# Patient Record
Sex: Female | Born: 1945
Health system: Southern US, Community
[De-identification: ages and names within clinical notes are randomized; demographics above are authoritative.]

## PROBLEM LIST (undated history)

## (undated) DIAGNOSIS — J45909 Unspecified asthma, uncomplicated: Secondary | ICD-10-CM

## (undated) DIAGNOSIS — J40 Bronchitis, not specified as acute or chronic: Secondary | ICD-10-CM

## (undated) DIAGNOSIS — E785 Hyperlipidemia, unspecified: Secondary | ICD-10-CM

## (undated) DIAGNOSIS — T7840XA Allergy, unspecified, initial encounter: Secondary | ICD-10-CM

## (undated) DIAGNOSIS — H409 Unspecified glaucoma: Secondary | ICD-10-CM

## (undated) DIAGNOSIS — D649 Anemia, unspecified: Secondary | ICD-10-CM

## (undated) DIAGNOSIS — H269 Unspecified cataract: Secondary | ICD-10-CM

## (undated) DIAGNOSIS — M1990 Unspecified osteoarthritis, unspecified site: Secondary | ICD-10-CM

## (undated) HISTORY — DX: Hyperlipidemia, unspecified: E78.5

## (undated) HISTORY — DX: Unspecified glaucoma: H40.9

## (undated) HISTORY — DX: Anemia, unspecified: D64.9

## (undated) HISTORY — PX: ROTATOR CUFF REPAIR: SHX139

## (undated) HISTORY — PX: COLONOSCOPY: SHX174

## (undated) HISTORY — DX: Allergy, unspecified, initial encounter: T78.40XA

## (undated) HISTORY — DX: Unspecified asthma, uncomplicated: J45.909

## (undated) HISTORY — DX: Unspecified osteoarthritis, unspecified site: M19.90

## (undated) HISTORY — DX: Unspecified cataract: H26.9

## (undated) HISTORY — DX: Bronchitis, not specified as acute or chronic: J40

---
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
## 1973-01-13 HISTORY — PX: EYE SURGERY: SHX253

## 1999-01-14 HISTORY — PX: FRACTURE SURGERY: SHX138

## 2007-01-12 ENCOUNTER — Other Ambulatory Visit: Admission: RE | Admit: 2007-01-12 | Discharge: 2007-01-12 | Payer: Self-pay | Admitting: Gynecology

## 2010-03-01 ENCOUNTER — Other Ambulatory Visit: Payer: Self-pay | Admitting: Obstetrics

## 2010-03-01 DIAGNOSIS — N94818 Other vulvodynia: Secondary | ICD-10-CM

## 2010-03-01 DIAGNOSIS — N393 Stress incontinence (female) (male): Secondary | ICD-10-CM

## 2010-03-08 ENCOUNTER — Ambulatory Visit
Admission: RE | Admit: 2010-03-08 | Discharge: 2010-03-08 | Disposition: A | Discharge status: Home / Self Care | Payer: 59 | Source: Ambulatory Visit | Attending: Obstetrics | Admitting: Obstetrics

## 2010-03-08 DIAGNOSIS — N393 Stress incontinence (female) (male): Secondary | ICD-10-CM

## 2010-03-08 DIAGNOSIS — N94818 Other vulvodynia: Secondary | ICD-10-CM

## 2010-03-08 MED ORDER — IOHEXOL 300 MG/ML  SOLN
100.0000 mL | Freq: Once | INTRAMUSCULAR | Status: AC | PRN
  Administered 2010-03-08: 100 mL via INTRAVENOUS

## 2011-10-28 ENCOUNTER — Ambulatory Visit (INDEPENDENT_AMBULATORY_CARE_PROVIDER_SITE_OTHER): Payer: 59 | Admitting: Family Medicine

## 2011-10-28 ENCOUNTER — Other Ambulatory Visit: Payer: Self-pay | Admitting: Radiology

## 2011-10-28 VITALS — BP 120/68 | HR 110 | Temp 98.2°F | Resp 16 | Ht 64.0 in | Wt 152.0 lb

## 2011-10-28 DIAGNOSIS — L2089 Other atopic dermatitis: Secondary | ICD-10-CM

## 2011-10-28 DIAGNOSIS — L209 Atopic dermatitis, unspecified: Secondary | ICD-10-CM

## 2011-10-28 DIAGNOSIS — J040 Acute laryngitis: Secondary | ICD-10-CM

## 2011-10-28 DIAGNOSIS — R05 Cough: Secondary | ICD-10-CM

## 2011-10-28 DIAGNOSIS — Z8701 Personal history of pneumonia (recurrent): Secondary | ICD-10-CM

## 2011-10-28 DIAGNOSIS — R059 Cough, unspecified: Secondary | ICD-10-CM

## 2011-10-28 MED ORDER — AZITHROMYCIN 250 MG PO TABS: ORAL_TABLET | ORAL | Status: DC

## 2011-10-28 NOTE — Patient Instructions (Signed)
Saline nasal spray atleast 4 times per day if any nasal congestion, over the counter mucinex , drink plenty of fluids, and voice rest as able.  Antibiotic prescribed - can wait for few days and start this if not improving. Return to clinic if any worsening. You can try Eucerin or lubriderm to area on ear twice per day and over the counter hydrocortisone up to twice per day if itching. If wet appearing, you can try the nystatin, but recheck if not improving.  Follow up with Dr. Milus Glazier for physical.

## 2011-10-28 NOTE — Progress Notes (Signed)
  Subjective:    Patient ID: Merlinda Frederick, female    DOB: 07/27/1945, 66 y.o.   MRN: [id number]  HPI LILIAH DORIAN is a 66 y.o. female Scratchy throat - started 3 nights ago - hoarseness, slight initial cough - few times.  Fatigue.  No known fever.  Slight increased cough, but feels congested in chest.  Sore in upper chest with cough.  Slightly shorter of breath with exertion only.  Recurrent bronchitis and pneumonia in past. Itching, irritated area on outside of R ear.  Has tried moisturizer, but nystatin has worked better in past.   Tx: water, chamomile tea.   Usually likes to take guaiefenesin syrup.  Husband also with recent illness seen here.   Review of Systems  Constitutional: Negative for fever and chills.  Respiratory: Positive for cough.        Objective:   Physical Exam  Vitals reviewed. Constitutional: She is oriented to person, place, and time. She appears well-developed and well-nourished.  HENT:  Head: Normocephalic and atraumatic.    Left Ear: External ear normal.  Mouth/Throat: Oropharynx is clear and moist. No oropharyngeal exudate.  Eyes: EOM are normal. Pupils are equal, round, and reactive to light.  Cardiovascular: Normal rate, regular rhythm, normal heart sounds and intact distal pulses.   Pulmonary/Chest: Effort normal and breath sounds normal. No respiratory distress. She has no wheezes. She has no rales.  Lymphadenopathy:    She has no cervical adenopathy.  Neurological: She is alert and oriented to person, place, and time.  Skin: Skin is warm and dry.  Psychiatric: She has a normal mood and affect. Her behavior is normal.      Assessment & Plan:  JARITZA DUIGNAN is a 66 y.o. female 1. Laryngitis  azithromycin (ZITHROMAX) 250 MG tablet  2. Cough  azithromycin (ZITHROMAX) 250 MG tablet  3. Atopic dermatitis    4. Hx of bacterial pneumonia      URI/laryngitis - with hx of bronchitis/pna.  suspected viral URI at this point but if not improving in next few  days, can fill z pak.    Likely eczema of outer ear - see below.,  rtc precautions, and has cpe scheduled.   Patient Instructions  Saline nasal spray atleast 4 times per day if any nasal congestion, over the counter mucinex , drink plenty of fluids, and voice rest as able.  Antibiotic prescribed - can wait for few days and start this if not improving. Return to clinic if any worsening. You can try Eucerin or lubriderm to area on ear twice per day and over the counter hydrocortisone up to twice per day if itching. If wet appearing, you can try the nystatin, but recheck if not improving.  Follow up with Dr. Milus Glazier for physical.

## 2011-11-15 ENCOUNTER — Ambulatory Visit (INDEPENDENT_AMBULATORY_CARE_PROVIDER_SITE_OTHER): Payer: 59 | Admitting: Family Medicine

## 2011-11-15 VITALS — BP 113/75 | HR 86 | Temp 98.4°F | Resp 16 | Ht 63.75 in | Wt 151.2 lb

## 2011-11-15 DIAGNOSIS — R0982 Postnasal drip: Secondary | ICD-10-CM

## 2011-11-15 DIAGNOSIS — J309 Allergic rhinitis, unspecified: Secondary | ICD-10-CM

## 2011-11-15 DIAGNOSIS — J029 Acute pharyngitis, unspecified: Secondary | ICD-10-CM

## 2011-11-15 LAB — POCT CBC
Granulocyte percent: 70.6 %G (ref 37–80)
Hemoglobin: 13.2 g/dL (ref 12.2–16.2)
MCH, POC: 29.5 pg (ref 27–31.2)
MCHC: 30.3 g/dL — AB (ref 31.8–35.4)
MPV: 8.3 fL (ref 0–99.8)
RBC: 4.47 M/uL (ref 4.04–5.48)
RDW, POC: 14.1 %

## 2011-11-15 LAB — POCT RAPID STREP A (OFFICE): Rapid Strep A Screen: NEGATIVE

## 2011-11-15 MED ORDER — MAGIC MOUTHWASH W/LIDOCAINE: 5.0000 mL | Freq: Four times a day (QID) | ORAL | Status: DC | PRN

## 2011-11-15 MED ORDER — FLUTICASONE PROPIONATE 50 MCG/ACT NA SUSP: 2.0000 | Freq: Every day | NASAL | Status: DC

## 2011-11-15 NOTE — Patient Instructions (Addendum)
Return to the clinic or go to the nearest emergency room if any of your symptoms worsen or new symptoms occur. Start symptomatic care for your sore throat as discussed, including lozenges, chloraseptic, or magic mouthwash if needed. flonase and zyrtec for allergies.  You can also try zantac for possible acid reflux contributing to your symptoms.  Your should receive a call or letter about your lab results within the next week to 10 days.

## 2011-11-15 NOTE — Progress Notes (Signed)
Subjective:    Patient ID: Janet Kramer, female    DOB: 1945-05-16, 66 y.o.   MRN: [id number]  HPI Janet Kramer is a 66 y.o. female  See OV 10/28/11  - dx with likely viral URI/laryngitis, cough, and hx of pneumonia - rx of  Zpak if not improving in few days. Started Z pak after temp went up to 102, green sputum - started Z pak around 10/30/11.  Improved after this, deep cough improved.  Fever resolved. Still with sore throat.  Still sore, but still fatigue, then more sore throat - this part is not improving.  Concerned about strep throat. No recent fever. Does clear throat frequently. Denies heartburn.  Cola few times past few weeks.    Tx: sage tea, saltwater gargles. Aspirin few times per week.    Review of Systems  Constitutional: Negative for fever and chills.  HENT: Negative for trouble swallowing and voice change.   Respiratory: Positive for cough (minimal.  ). Negative for chest tightness and shortness of breath.   Cardiovascular: Negative for chest pain.        Objective:   Physical Exam  Constitutional: She is oriented to person, place, and time. She appears well-developed and well-nourished. No distress.  HENT:  Head: Normocephalic and atraumatic.  Right Ear: Hearing, tympanic membrane, external ear and ear canal normal.  Left Ear: Hearing, tympanic membrane, external ear and ear canal normal.  Nose: Nose normal.  Mouth/Throat: Oropharynx is clear and moist. No oropharyngeal exudate.  Eyes: Conjunctivae normal and EOM are normal. Pupils are equal, round, and reactive to light.  Cardiovascular: Normal rate, regular rhythm, normal heart sounds and intact distal pulses.   No murmur heard. Pulmonary/Chest: Effort normal and breath sounds normal. No respiratory distress. She has no wheezes. She has no rhonchi.  Abdominal: Soft. There is no tenderness. There is no rebound and no guarding.  Lymphadenopathy:    She has no cervical adenopathy (no enlargement - ttp AC's  -bilaterally. ).  Neurological: She is alert and oriented to person, place, and time.  Skin: Skin is warm and dry. No rash noted.  Psychiatric: She has a normal mood and affect. Her behavior is normal.   Results for orders placed in visit on 11/15/11  POCT RAPID STREP A (OFFICE)      Component Value Range   Rapid Strep A Screen Negative  Negative  POCT CBC      Component Value Range   WBC 7.4  4.6 - 10.2 K/uL   Lymph, poc 1.7  0.6 - 3.4   POC LYMPH PERCENT 23.3  10 - 50 %L   MID (cbc) 0.5  0 - 0.9   POC MID % 6.1  0 - 12 %M   POC Granulocyte 5.2  2 - 6.9   Granulocyte percent 70.6  37 - 80 %G   RBC 4.47  4.04 - 5.48 M/uL   Hemoglobin 13.2  12.2 - 16.2 g/dL   HCT, POC 30.8  65.7 - 47.9 %   MCV 97.6 (*) 80 - 97 fL   MCH, POC 29.5  27 - 31.2 pg   MCHC 30.3 (*) 31.8 - 35.4 g/dL   RDW, POC 84.6     Platelet Count, POC 314  142 - 424 K/uL   MPV 8.3  0 - 99.8 fL      Assessment & Plan:  Janet Kramer is a 66 y.o. female 1. Sorethroat  POCT rapid strep A, POCT CBC, Culture,  Group A Strep, Alum & Mag Hydroxide-Simeth (MAGIC MOUTHWASH W/LIDOCAINE) SOLN  2. PND (post-nasal drip)  fluticasone (FLONASE) 50 MCG/ACT nasal spray  3. Allergic rhinitis     Sx care for A.R, viral pharyngitis,  or LPR Check throat cx, sx care as below, rtc, precautions.   Patient Instructions  Return to the clinic or go to the nearest emergency room if any of your symptoms worsen or new symptoms occur. Start symptomatic care for your sore throat as discussed, including lozenges, chloraseptic, or magic mouthwash if needed. flonase and zyrtec for allergies.  You can also try zantac for possible acid reflux contributing to your symptoms.  Your should receive a call or letter about your lab results within the next week to 10 days.

## 2011-11-17 LAB — CULTURE, GROUP A STREP: Organism ID, Bacteria: NORMAL

## 2011-11-20 ENCOUNTER — Ambulatory Visit: Payer: 59

## 2011-11-20 ENCOUNTER — Encounter: Payer: Self-pay | Admitting: Family Medicine

## 2011-11-20 ENCOUNTER — Ambulatory Visit: Payer: Self-pay

## 2011-11-20 ENCOUNTER — Ambulatory Visit (INDEPENDENT_AMBULATORY_CARE_PROVIDER_SITE_OTHER): Payer: 59 | Admitting: Internal Medicine

## 2011-11-20 VITALS — BP 120/70 | HR 82 | Temp 98.3°F | Resp 16 | Ht 63.0 in | Wt 154.4 lb

## 2011-11-20 DIAGNOSIS — R05 Cough: Secondary | ICD-10-CM

## 2011-11-20 DIAGNOSIS — Z0000 Encounter for general adult medical examination without abnormal findings: Secondary | ICD-10-CM

## 2011-11-20 DIAGNOSIS — Z800 Family history of malignant neoplasm of digestive organs: Secondary | ICD-10-CM

## 2011-11-20 LAB — COMPREHENSIVE METABOLIC PANEL
ALT: 39 U/L — ABNORMAL HIGH (ref 0–35)
AST: 27 U/L (ref 0–37)
Albumin: 4.1 g/dL (ref 3.5–5.2)
Alkaline Phosphatase: 79 U/L (ref 39–117)
BUN: 14 mg/dL (ref 6–23)
Calcium: 9.3 mg/dL (ref 8.4–10.5)
Chloride: 105 mEq/L (ref 96–112)
Potassium: 4.1 mEq/L (ref 3.5–5.3)
Sodium: 138 mEq/L (ref 135–145)
Total Protein: 6.8 g/dL (ref 6.0–8.3)

## 2011-11-20 LAB — LIPID PANEL
LDL Cholesterol: 138 mg/dL — ABNORMAL HIGH (ref 0–99)
VLDL: 32 mg/dL (ref 0–40)

## 2011-11-20 LAB — POCT URINALYSIS DIPSTICK
Bilirubin, UA: NEGATIVE
Glucose, UA: NEGATIVE
Nitrite, UA: NEGATIVE
Urobilinogen, UA: 0.2

## 2011-11-20 NOTE — Progress Notes (Signed)
  Subjective:    Patient ID: Janet Kramer, female    DOB: [birth year], 66 y.o.   MRN: [id number]  HPI    Review of Systems  Constitutional: Positive for fatigue.  HENT: Positive for congestion, trouble swallowing, voice change and postnasal drip.   Eyes: Negative.   Respiratory: Positive for cough, shortness of breath and wheezing.   Cardiovascular: Negative.   Gastrointestinal: Negative.   Musculoskeletal: Positive for arthralgias.  Skin: Negative.   Neurological: Negative.   Hematological: Positive for adenopathy.  Psychiatric/Behavioral: Negative.        Objective:   Physical Exam        Assessment & Plan:

## 2011-11-20 NOTE — Progress Notes (Signed)
  Subjective:    Patient ID: Janet Kramer, female    DOB: 03/27/1945, 66 y.o.   MRN: [id number]  HPI Here for medicare cpe. Has post infection cough, counseled. See health hx Dad died of colon cancer , mom lung cancer   Review of Systems See ros scanned    Objective:   Physical Exam  Constitutional: She is oriented to person, place, and time. She appears well-developed and well-nourished.  HENT:  Right Ear: External ear normal.  Left Ear: External ear normal.  Nose: Nose normal.  Mouth/Throat: Oropharynx is clear and moist.  Eyes: EOM are normal. Pupils are equal, round, and reactive to light.  Neck: Normal range of motion. Neck supple. No tracheal deviation present. No thyromegaly present.  Cardiovascular: Normal rate, regular rhythm and normal heart sounds.   Pulmonary/Chest: Effort normal and breath sounds normal. No respiratory distress. She has no wheezes. She has no rales. Right breast exhibits no inverted nipple, no mass, no nipple discharge, no skin change and no tenderness. Left breast exhibits no inverted nipple, no mass, no nipple discharge, no skin change and no tenderness. Breasts are symmetrical.  Abdominal: Bowel sounds are normal. There is no tenderness.  Musculoskeletal: Normal range of motion. She exhibits no edema and no tenderness.  Lymphadenopathy:    She has cervical adenopathy.  Neurological: She is alert and oriented to person, place, and time. She has normal reflexes. She displays normal reflexes. No cranial nerve deficit. She exhibits normal muscle tone. Coordination normal.  Skin: Skin is warm and dry.  Psychiatric: She has a normal mood and affect. Her behavior is normal. Judgment and thought content normal.   [facility] reading (PRIMARY) by  Dr.Guest cxr nl         Assessment & Plan:  Very healthy Schedule colonocopy Offer flu,tdap,shingles vac.

## 2011-11-20 NOTE — Patient Instructions (Signed)

## 2011-11-21 LAB — IFOBT (OCCULT BLOOD): IFOBT: NEGATIVE

## 2011-12-05 ENCOUNTER — Encounter: Payer: Self-pay | Admitting: Gastroenterology

## 2012-01-20 ENCOUNTER — Encounter: Payer: Self-pay | Admitting: Gastroenterology

## 2012-01-20 ENCOUNTER — Ambulatory Visit (AMBULATORY_SURGERY_CENTER): Payer: 59 | Admitting: *Deleted

## 2012-01-20 VITALS — Ht 64.0 in | Wt 155.8 lb

## 2012-01-20 DIAGNOSIS — Z1211 Encounter for screening for malignant neoplasm of colon: Secondary | ICD-10-CM

## 2012-01-20 MED ORDER — MOVIPREP 100 G PO SOLR: ORAL | Status: DC

## 2012-02-03 ENCOUNTER — Ambulatory Visit (AMBULATORY_SURGERY_CENTER): Payer: 59 | Admitting: Gastroenterology

## 2012-02-03 ENCOUNTER — Encounter: Payer: Self-pay | Admitting: Gastroenterology

## 2012-02-03 VITALS — BP 126/73 | HR 64 | Temp 97.3°F | Resp 17 | Ht 64.0 in | Wt 155.0 lb

## 2012-02-03 DIAGNOSIS — Z800 Family history of malignant neoplasm of digestive organs: Secondary | ICD-10-CM

## 2012-02-03 DIAGNOSIS — D126 Benign neoplasm of colon, unspecified: Secondary | ICD-10-CM

## 2012-02-03 DIAGNOSIS — Z1211 Encounter for screening for malignant neoplasm of colon: Secondary | ICD-10-CM

## 2012-02-03 MED ORDER — SODIUM CHLORIDE 0.9 % IV SOLN: 500.0000 mL | INTRAVENOUS | Status: DC

## 2012-02-03 NOTE — Progress Notes (Signed)
Called to room to assist during endoscopic procedure.  Patient ID and intended procedure confirmed with present staff. Received instructions for my participation in the procedure from the performing physician.  

## 2012-02-03 NOTE — Op Note (Signed)
 [facility] [address] [place], [address]   COLONOSCOPY PROCEDURE REPORT  PATIENT: Janet, Kramer  MR#: [id number] BIRTHDATE: 1945-04-30 , 66  yrs. old GENDER: Female ENDOSCOPIST: Meryl Dare, MD, [facility] PROCEDURE DATE:  02/03/2012 PROCEDURE:   Colonoscopy with snare polypectomy and Colonoscopy with biopsy ASA CLASS:   Class II INDICATIONS:elevated risk screening-patient's immediate family history of colon cancer. MEDICATIONS: MAC sedation, administered by CRNA and propofol (Diprivan) 150mg  IV DESCRIPTION OF PROCEDURE:   After the risks benefits and alternatives of the procedure were thoroughly explained, informed consent was obtained.  A digital rectal exam revealed no abnormalities of the rectum.   The LB [id number]  endoscope was introduced through the anus and advanced to the cecum, which was identified by both the appendix and ileocecal valve. No adverse events experienced with a tortuous sigmoid colon-moderately difficult to traverse.   The quality of the prep was excellent, using MoviPrep  The instrument was then slowly withdrawn as the colon was fully examined.  COLON FINDINGS: Two sessile polyps measuring 4-5 mm in size were found in the descending colon.  A polypectomy was performed with a cold snare.  The resection was complete and the polyp tissue was completely retrieved.  A polypectomy was performed with cold forceps.  The resection was complete and the polyp tissue was completely retrieved.   Moderate diverticulosis was noted in the sigmoid colon.   The colon was otherwise normal.  There was no diverticulosis, inflammation, polyps or cancers unless previously stated.  Retroflexed views revealed small internal hemorrhoids. The time to cecum=6 minutes 45 seconds.  Withdrawal time=9 minutes 22 seconds.  The scope was withdrawn and the procedure completed.  COMPLICATIONS: There were no complications.  ENDOSCOPIC IMPRESSION: 1.   Two  sessile polyps measuring 4-5 mm in the descending colon; polypectomy performed with a cold snare; polypectomy was performed with cold forceps 2.   Moderate diverticulosis was noted in the sigmoid colon 3.   Small internal hemorrhoids  RECOMMENDATIONS: 1.  Await pathology results 2.  High fiber diet with liberal fluid intake. 3.  Repeat Colonoscopy in 5 years.  eSigned:  Meryl Dare, MD, [facility] 02/03/2012 10:47 AM   cc: Robert Bellow, MD

## 2012-02-03 NOTE — Progress Notes (Signed)
Patient did not experience any of the following events: a burn prior to discharge; a fall within the facility; wrong site/side/patient/procedure/implant event; or a hospital transfer or hospital admission upon discharge from the facility. (G8907) Patient did not have preoperative order for IV antibiotic SSI prophylaxis. (G8918)  

## 2012-02-03 NOTE — Patient Instructions (Addendum)

## 2012-02-03 NOTE — Progress Notes (Signed)
Lidocaine-40mg IV prior to Propofol InductionPropofol given over incremental dosages 

## 2012-02-04 ENCOUNTER — Telehealth: Payer: Self-pay

## 2012-02-04 NOTE — Telephone Encounter (Signed)
Left message on answering machine. 

## 2012-02-09 ENCOUNTER — Encounter: Payer: Self-pay | Admitting: Gastroenterology

## 2013-02-09 ENCOUNTER — Ambulatory Visit (INDEPENDENT_AMBULATORY_CARE_PROVIDER_SITE_OTHER): Payer: 59 | Admitting: Family Medicine

## 2013-02-09 VITALS — BP 122/78 | HR 88 | Temp 98.8°F | Resp 18 | Ht 63.5 in | Wt 146.0 lb

## 2013-02-09 DIAGNOSIS — R6889 Other general symptoms and signs: Secondary | ICD-10-CM

## 2013-02-09 DIAGNOSIS — J111 Influenza due to unidentified influenza virus with other respiratory manifestations: Secondary | ICD-10-CM

## 2013-02-09 DIAGNOSIS — J029 Acute pharyngitis, unspecified: Secondary | ICD-10-CM

## 2013-02-09 LAB — POCT RAPID STREP A (OFFICE): Rapid Strep A Screen: NEGATIVE

## 2013-02-09 LAB — POCT INFLUENZA A/B
Influenza A, POC: POSITIVE
Influenza B, POC: NEGATIVE

## 2013-02-09 NOTE — Progress Notes (Signed)
 Chief Complaint:  Chief Complaint  Patient presents with  . Cough    x sunday   . Bronchitis    HPI: Janet Kramer is a 68 y.o. female who is here for : 3-4 day history of sore throat, and weak,and she was coughign and fever Temp 102, ASa took care of it. Tired.  NOthing came up when she cough, No ear no sinus pressure, no HA. No rashes.+ vomiting and nausea x 1 Has a history of ashtma. No SOB, wheezing or CP. 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 Bronchitis     has recurrent bronchitis  . Allergy   . Arthritis   . Asthma   . Glaucoma   . Cataract    Past Surgical History  Procedure Laterality Date  . Tonsillectomy  1949  . Fracture surgery  2001    ankle - right     . Eye surgery  1975    right   History   Social History  . Marital Status: Married    Spouse Name: N/A    Number of Children: N/A  . Years of Education: N/A   Occupational History  . tutor [employer]   Social History Main Topics  . Smoking status: Never Smoker   . Smokeless tobacco: Never Used  . Alcohol Use: Yes     Comment: occasional  . Drug Use: No  . Sexual Activity: None   Other Topics Concern  . None   Social History Narrative  . None   Family History  Problem Relation Age of Onset  . Cancer Mother     colon  . Colon cancer Mother 39  . Cancer Father     lung  . Diabetes Paternal Grandmother   . Stroke Maternal Grandmother   . Heart disease Maternal Grandfather   . Stomach cancer Neg Hx    Allergies  Allergen Reactions  . Codeine     Overly sleepy  . Oxycontin [Oxycodone Hcl] Nausea And Vomiting   Prior to Admission medications   Medication Sig Start Date End Date Taking? Authorizing Provider  aspirin 325 MG tablet Take 325 mg by mouth daily.   Yes Historical Provider, MD  latanoprost (XALATAN) 0.005 % ophthalmic solution Place 1 drop into  both eyes at bedtime.   Yes Historical Provider, MD     ROS: The patient denies night sweats, unintentional weight loss, chest pain, palpitations, wheezing, dyspnea on exertion, nausea, vomiting, abdominal pain, dysuria, hematuria, melena, numbness, , or tingling.  All other systems have been reviewed and were otherwise negative with the exception of those mentioned in the HPI and as above.    PHYSICAL EXAM: Filed Vitals:   02/09/13 1341  BP: 122/78  Pulse: 88  Temp: 98.8 F (37.1 C)  Resp: 18   Filed Vitals:   02/09/13 1341  Height: 5' 3.5" (1.613 m)  Weight: 146 lb (66.225 kg)   Body mass index is 25.45 kg/(m^2).  General: Alert, no acute distress HEENT:  Normocephalic, atraumatic, oropharynx patent. EOMI, PERRLA, Tmnl, throat erythematous, no exudates. sinue nontender Cardiovascular:  Regular rate and rhythm, no rubs murmurs or gallops.  No Carotid bruits, radial pulse intact. No pedal edema.  Respiratory: Clear to auscultation bilaterally.  No wheezes, rales, or rhonchi.  No cyanosis, no use of accessory musculature GI: No organomegaly,  abdomen is soft and non-tender, positive bowel sounds.  No masses. Skin: No rashes. Neurologic: Facial musculature symmetric. Psychiatric: Patient is appropriate throughout our interaction. Lymphatic: No cervical lymphadenopathy Musculoskeletal: Gait intact.   LABS: Results for orders placed in visit on 02/09/13  POCT INFLUENZA A/B      Result Value Range   Influenza A, POC Positive     Influenza B, POC Negative    POCT RAPID STREP A (OFFICE)      Result Value Range   Rapid Strep A Screen Negative  Negative     EKG/XRAY:   Primary read interpreted by Dr. Marin Comment at [facility].   ASSESSMENT/PLAN: Encounter Diagnoses  Name Primary?  . Flu-like symptoms   . Acute pharyngitis   . Influenza Yes    Declined Tamiflu since 4 days out Will treat sxs  Gave sxampel of mucinex, cepachol F/u prn Gross sideeffects, risk and benefits, and  alternatives of medications d/w patient. Patient is aware that all medications have potential sideeffects and we are unable to predict every sideeffect or drug-drug interaction that may occur.  , [place], DO 02/09/2013 3:20 PM

## 2013-02-09 NOTE — Patient Instructions (Signed)

## 2013-06-21 ENCOUNTER — Telehealth: Payer: Self-pay

## 2013-06-21 DIAGNOSIS — Z1231 Encounter for screening mammogram for malignant neoplasm of breast: Secondary | ICD-10-CM

## 2013-06-21 NOTE — Telephone Encounter (Signed)
Pt would like to be scheduled for a routine screening mammogram. I have pended

## 2013-06-21 NOTE — Telephone Encounter (Signed)
Pt is needing a referal for a mammogram, has seen Dr.L, Dr.guest, and Dr. Marin Comment over the years; would like to know if she needs an appt. Or can just be written one

## 2013-06-22 ENCOUNTER — Ambulatory Visit (INDEPENDENT_AMBULATORY_CARE_PROVIDER_SITE_OTHER): Payer: 59 | Admitting: Family Medicine

## 2013-06-22 VITALS — BP 122/70 | HR 71 | Temp 98.7°F | Resp 16 | Ht 63.5 in | Wt 146.0 lb

## 2013-06-22 DIAGNOSIS — N6315 Unspecified lump in the right breast, overlapping quadrants: Secondary | ICD-10-CM

## 2013-06-22 DIAGNOSIS — N6320 Unspecified lump in the left breast, unspecified quadrant: Secondary | ICD-10-CM

## 2013-06-22 DIAGNOSIS — N6310 Unspecified lump in the right breast, unspecified quadrant: Principal | ICD-10-CM

## 2013-06-22 DIAGNOSIS — N6325 Unspecified lump in the left breast, overlapping quadrants: Secondary | ICD-10-CM

## 2013-06-22 DIAGNOSIS — N63 Unspecified lump in unspecified breast: Secondary | ICD-10-CM

## 2013-06-22 NOTE — Progress Notes (Signed)
Subjective:    Patient ID: Janet Kramer, female    DOB: 01/08/1946, 68 y.o.   MRN: [id number]  This chart was scribed for Wendie Agreste, MD by Maree Erie, ED Scribe.  Chief Complaint  Patient presents with  . Referral    pt needs a referral for mammogram    PCP: Robyn Haber, MD  HPI  Janet Kramer is a 68 y.o. female who presents to office to get a referral for a mammogram. Last seen here for acute illness in 01/2013, prior to that was a physical in 11/2011. Sent for colonoscopy 01/2012. Polyps noted. Plan to repeat in five years.   Right Breast Irregularity: Here to discuss mammogram referral today. She states that she noticed an indentation last week in her right breast that she has not seen before. She does not know if she can feel an irregularity underneath the indentation. The asymmetry only appears when she is bending over and comparing the breasts. She denies nipple bleeding or discharge or pain. She denies any fever, chills, night sweats or weight loss. She denies a family history of breast cancer or gynecological cancers. She reports her mother had a history of colon cancer but also suffered from diverticulitis and obesity for fifty years. She does not remember her last mammogram date but believes it was at least three years ago. She states that her first mammogram was irregular, but the irregularity was diagnosed as a cyst via [place]. She believes the mammograms since this finding were normal.     There are no active problems to display for this patient.  Past Medical History  Diagnosis Date  . Bronchitis     has recurrent bronchitis  . Allergy   . Arthritis   . Asthma   . Glaucoma   . Cataract    Past Surgical History  Procedure Laterality Date  . Tonsillectomy  1949  . Fracture surgery  2001    ankle - right     . Eye surgery  1975    right   Allergies  Allergen Reactions  . Codeine     Overly sleepy  . Oxycontin [Oxycodone Hcl] Nausea And Vomiting    Prior to Admission medications   Medication Sig Start Date End Date Taking? Authorizing Provider  aspirin 325 MG tablet Take 325 mg by mouth daily.   Yes Historical Provider, MD  latanoprost (XALATAN) 0.005 % ophthalmic solution Place 1 drop into both eyes at bedtime.   Yes Historical Provider, MD   History   Social History  . Marital Status: Married    Spouse Name: N/A    Number of Children: N/A  . Years of Education: N/A   Occupational History  . tutor [employer]   Social History Main Topics  . Smoking status: Never Smoker   . Smokeless tobacco: Never Used  . Alcohol Use: Yes     Comment: occasional  . Drug Use: No  . Sexual Activity: Not on file   Other Topics Concern  . Not on file   Social History Narrative  . No narrative on file     Review of Systems  Constitutional: Negative for fever and chills.  HENT: Negative for rhinorrhea.   Eyes: Negative for visual disturbance.  Respiratory: Negative for cough and shortness of breath.   Cardiovascular: Negative for chest pain and leg swelling.  Gastrointestinal: Negative for nausea, vomiting, abdominal pain and diarrhea.  Genitourinary: Negative for dysuria.  Musculoskeletal: Negative for back pain.  Skin: Negative for rash.  Neurological: Negative for headaches.  Hematological: Does not bruise/bleed easily.  Psychiatric/Behavioral: Negative for confusion.       Objective:   Physical Exam  Nursing note and vitals reviewed. Constitutional: She is oriented to person, place, and time. She appears well-developed and well-nourished. No distress.  HENT:  Head: Normocephalic and atraumatic.  Eyes: Conjunctivae and EOM are normal.  Neck: Normal range of motion. No tracheal deviation present.  Cardiovascular: Normal rate.   Pulmonary/Chest: Effort normal. No respiratory distress.  Slight firm area, slight nodularity to upper-mid right breast, approximately 3-4 cm above nipple when lying flat. It does  appear to be mobile. 1-2 cm below nipple in left breast there is a mobile lump.  Musculoskeletal: Normal range of motion.  Lymphadenopathy:    She has no axillary adenopathy.       Right: No supraclavicular adenopathy present.       Left: No supraclavicular adenopathy present.  No supraclavicular or axillary adenopathy bilaterally.  Neurological: She is alert and oriented to person, place, and time.  Skin: Skin is warm and dry.  Psychiatric: She has a normal mood and affect. Her behavior is normal.    Filed Vitals:   06/22/13 1200  BP: 122/70  Pulse: 71  Temp: 98.7 F (37.1 C)  TempSrc: Oral  Resp: 16  Height: 5' 3.5" (1.613 m)  Weight: 146 lb (66.225 kg)  SpO2: 98%       Assessment & Plan:   Janet Kramer is a 68 y.o. female Breast lump on right side at 12 o'clock position - Plan: MM Digital Diagnostic Bilat  Breast lump on left side at 6 o'clock position - Plan: MM Digital Diagnostic Bilat Possible change in skin appearance/retraction with leaning forward on left. Questionable soft tissue lumps/bilaterraly as above. No known FH of breast CA, ovarian CA,  peritoneal CA or other known malignancy. Will refer for diagnostic MMG.    Will schedule CPE - requests Dr. Marin Comment or Dr. Joseph Art - message sent to scheduling.   No orders of the defined types were placed in this encounter.   Patient Instructions  We will refer you for the mammogram as discussed.  You should receive a call about this in the next week to 10 days. If you have not heard from [place] at that time - call me to make sure this has been scheduled.   We will call you to schedule physical with Dr. Marin Comment or Dr. Joseph Art.    I personally performed the services described in this documentation, which was scribed in my presence. The recorded information has been reviewed and considered, and addended by me as needed.

## 2013-06-22 NOTE — Patient Instructions (Signed)
We will refer you for the mammogram as discussed.  You should receive a call about this in the next week to 10 days. If you have not heard from [place] at that time - call me to make sure this has been scheduled.   We will call you to schedule physical with Dr. Marin Comment or Dr. Joseph Art.

## 2013-06-27 ENCOUNTER — Telehealth: Payer: Self-pay

## 2013-06-27 NOTE — Telephone Encounter (Signed)
PT CALLED IN WANTING TO KNOW IF THE REFERRAL HAD BEEN PUT IN TO THE BRE[facility]. STATED DR TOLD HER TO CALL TODAY AND FIND OUT.  SHE WANTS TO BE CALLED BACK AT [phone]

## 2013-06-27 NOTE — Telephone Encounter (Signed)
Ok to schedule mammogram

## 2013-06-28 ENCOUNTER — Other Ambulatory Visit: Payer: Self-pay | Admitting: Family Medicine

## 2013-06-28 DIAGNOSIS — N6320 Unspecified lump in the left breast, unspecified quadrant: Secondary | ICD-10-CM

## 2013-06-28 DIAGNOSIS — N6310 Unspecified lump in the right breast, unspecified quadrant: Principal | ICD-10-CM

## 2013-06-28 DIAGNOSIS — N6315 Unspecified lump in the right breast, overlapping quadrants: Secondary | ICD-10-CM

## 2013-06-28 DIAGNOSIS — N6325 Unspecified lump in the left breast, overlapping quadrants: Secondary | ICD-10-CM

## 2013-06-29 LAB — HM MAMMOGRAPHY

## 2013-06-29 NOTE — Telephone Encounter (Signed)
Spoke to patient's husband.  Patient is currently having her breast imaging done.

## 2013-07-06 NOTE — Progress Notes (Signed)
Appointment scheduled on 07/22/2013 @ 10 am for CPE

## 2013-07-22 ENCOUNTER — Ambulatory Visit (INDEPENDENT_AMBULATORY_CARE_PROVIDER_SITE_OTHER): Payer: 59 | Admitting: Family Medicine

## 2013-07-22 ENCOUNTER — Encounter: Payer: Self-pay | Admitting: Family Medicine

## 2013-07-22 VITALS — BP 110/70 | HR 68 | Temp 97.9°F | Resp 16 | Ht 63.0 in | Wt 152.2 lb

## 2013-07-22 DIAGNOSIS — E785 Hyperlipidemia, unspecified: Secondary | ICD-10-CM

## 2013-07-22 DIAGNOSIS — Z23 Encounter for immunization: Secondary | ICD-10-CM

## 2013-07-22 DIAGNOSIS — Z0000 Encounter for general adult medical examination without abnormal findings: Secondary | ICD-10-CM

## 2013-07-22 LAB — LIPID PANEL
Cholesterol: 270 mg/dL — ABNORMAL HIGH (ref 0–200)
HDL: 76 mg/dL (ref >39)
LDL Cholesterol: 170 mg/dL — ABNORMAL HIGH (ref 0–99)
Total CHOL/HDL Ratio: 3.6 Ratio
Triglycerides: 122 mg/dL (ref <150)
VLDL: 24 mg/dL (ref 0–40)

## 2013-07-22 LAB — POCT URINALYSIS DIPSTICK
Bilirubin, UA: NEGATIVE
Glucose, UA: NEGATIVE
Ketones, UA: NEGATIVE
Leukocytes, UA: NEGATIVE
Nitrite, UA: NEGATIVE
Protein, UA: NEGATIVE
Spec Grav, UA: <=1.005
Urobilinogen, UA: 0.2
pH, UA: 5.5

## 2013-07-22 LAB — CBC
HCT: 39.3 % (ref 36.0–46.0)
Hemoglobin: 13.5 g/dL (ref 12.0–15.0)
MCH: 31.3 pg (ref 26.0–34.0)
MCHC: 34.4 g/dL (ref 30.0–36.0)
MCV: 91 fL (ref 78.0–100.0)
Platelets: 216 10*3/uL (ref 150–400)
RBC: 4.32 MIL/uL (ref 3.87–5.11)
RDW: 13.6 % (ref 11.5–15.5)
WBC: 4.6 10*3/uL (ref 4.0–10.5)

## 2013-07-22 LAB — COMPLETE METABOLIC PANEL WITHOUT GFR
AST: 20 U/L (ref 0–37)
Albumin: 4.4 g/dL (ref 3.5–5.2)
Alkaline Phosphatase: 53 U/L (ref 39–117)
Calcium: 9.3 mg/dL (ref 8.4–10.5)
Chloride: 106 meq/L (ref 96–112)
Sodium: 141 meq/L (ref 135–145)
Total Protein: 7.2 g/dL (ref 6.0–8.3)

## 2013-07-22 LAB — COMPLETE METABOLIC PANEL WITH GFR
ALT: 18 U/L (ref 0–35)
BUN: 19 mg/dL (ref 6–23)
CO2: 23 mEq/L (ref 19–32)
Creat: 0.88 mg/dL (ref 0.50–1.10)
GFR, Est African American: 77 mL/min
GFR, Est Non African American: 67 mL/min
Glucose, Bld: 92 mg/dL (ref 70–99)
Potassium: 4.1 mEq/L (ref 3.5–5.3)
Total Bilirubin: 0.6 mg/dL (ref 0.2–1.2)

## 2013-07-22 LAB — TSH: TSH: 1.621 u[IU]/mL (ref 0.350–4.500)

## 2013-07-22 NOTE — Progress Notes (Signed)
 Chief Complaint: No chief complaint on file.   HPI: Janet Kramer is a 68 y.o. female who is here for annual PE She is UTD on her colonscopy-had one with Dr Fuller Plan in 2014, 2 polypectomy, f/u colonscopy in 2019 She has ahd mamogram and [place] of breast in 06/2013 for breast cysts, there is supposed to be follow-up in 6 months She has had pap, no abnormal in the past, she does not remember but within last 5 years with ob/gyn  or so for when she had dypareunia, no urinary or pelvic sxs currently She was offered tetanus, shingles last CPE in 2013  but has not done it She sees her eye doctor regular for glaucoma check, recently had retinal exam done because of concerns for detachment but just a pigment in the are of her right eye She has a GD with melanoma since age 61.  Mom with history of colon cancer No urinary symptoms  Past Medical History  Diagnosis Date  . Bronchitis     has recurrent bronchitis  . Allergy   . Arthritis   . Asthma   . Glaucoma   . Cataract   . Hyperlipidemia    Past Surgical History  Procedure Laterality Date  . Tonsillectomy  1949  . Fracture surgery  2001    ankle - right     . Eye surgery  1975    right   History   Social History  . Marital Status: Married    Spouse Name: N/A    Number of Children: N/A  . Years of Education: N/A   Occupational History  . tutor [employer]   Social History Main Topics  . Smoking status: Never Smoker   . Smokeless tobacco: Never Used  . Alcohol Use: Yes     Comment: occasional  . Drug Use: No  . Sexual Activity: None   Other Topics Concern  . None   Social History Narrative  . None   Family History  Problem Relation Age of Onset  . Cancer Mother     colon  . Colon cancer Mother 62  . Cancer Father     lung  . Diabetes Paternal Grandmother   . Stroke Maternal Grandmother   . Heart disease Maternal Grandfather   . Stomach cancer Neg Hx    Allergies  Allergen Reactions  . Codeine       Overly sleepy  . Oxycontin [Oxycodone Hcl] Nausea And Vomiting   Prior to Admission medications   Medication Sig Start Date End Date Taking? Authorizing Provider  aspirin 325 MG tablet Take 325 mg by mouth daily.   Yes Historical Provider, MD  latanoprost (XALATAN) 0.005 % ophthalmic solution Place 1 drop into both eyes at bedtime.   Yes Historical Provider, MD  Multiple Vitamin (MULTIVITAMIN) tablet Take 1 tablet by mouth daily.   Yes Historical Provider, MD     ROS: The patient denies fevers, chills, night sweats, unintentional weight loss, chest pain, palpitations, wheezing, dyspnea on exertion, nausea, vomiting, abdominal pain, dysuria, hematuria, melena, numbness, weakness, or tingling.   All other systems have been reviewed and were otherwise negative with the exception of those mentioned in the HPI and as above.    PHYSICAL EXAM: Filed Vitals:   07/22/13 1010  BP: 110/70  Pulse: 68  Temp: 97.9 F (36.6 C)  Resp: 16   Filed Vitals:   07/22/13 1010  Height: 5\' 3"  (1.6 m)  Weight: 152  lb 3.2 oz (69.037 kg)   Body mass index is 26.97 kg/(m^2).  General: Alert, no acute distress HEENT:  Normocephalic, atraumatic, oropharynx patent. EOMI, PERRLA, fundoexam is normal, tm nl no thyroidmegaly Cardiovascular:  Regular rate and rhythm, no rubs murmurs or gallops.  No Carotid bruits, radial pulse intact. No pedal edema.  Respiratory: Clear to auscultation bilaterally.  No wheezes, rales, or rhonchi.  No cyanosis, no use of accessory musculature GI: No organomegaly, abdomen is soft and non-tender, positive bowel sounds.  No masses. Skin: No rashes. + SK Neurologic: Facial musculature symmetric. Psychiatric: Patient is appropriate throughout our interaction. Lymphatic: No cervical lymphadenopathy Musculoskeletal: Gait intact. Breast exam normal Defer GU exam    LABS: Results for orders placed in visit on 07/22/13  POCT URINALYSIS DIPSTICK      Result Value Ref Range    Color, UA yellow     Clarity, UA clear     Glucose, UA neg     Bilirubin, UA neg     Ketones, UA neg     Spec Grav, UA <=1.005     Blood, UA trace     pH, UA 5.5     Protein, UA neg     Urobilinogen, UA 0.2     Nitrite, UA neg     Leukocytes, UA Negative       EKG/XRAY:   Primary read interpreted by [facility]. Marin Comment at [facility].   ASSESSMENT/PLAN: Encounter Diagnoses  Name Primary?  . Annual physical exam Yes  . Hyperlipidemia   . Need for shingles vaccine    Rx for shingles given She is to follow up with mammo/US for breast cyst in 6 months She is UTD on colonoscopy, next one is 2019 She has had trace blood in her urine since 2013, deferred pelvic exam today.  Annual labs pending F/u prn   Gross sideeffects, risk and benefits, and alternatives of medications d/w patient. Patient is aware that all medications have potential sideeffects and we are unable to predict every sideeffect or drug-drug interaction that may occur.  , Dearing, DO 07/22/2013 12:53 PM

## 2013-07-23 ENCOUNTER — Encounter: Payer: Self-pay | Admitting: Family Medicine

## 2013-12-09 ENCOUNTER — Ambulatory Visit (INDEPENDENT_AMBULATORY_CARE_PROVIDER_SITE_OTHER): Payer: 59 | Admitting: Emergency Medicine

## 2013-12-09 VITALS — BP 122/64 | HR 84 | Temp 97.7°F | Resp 18 | Ht 63.0 in | Wt 149.8 lb

## 2013-12-09 DIAGNOSIS — M7551 Bursitis of right shoulder: Secondary | ICD-10-CM

## 2013-12-09 DIAGNOSIS — M25511 Pain in right shoulder: Secondary | ICD-10-CM

## 2013-12-09 MED ORDER — TRIAMCINOLONE ACETONIDE 40 MG/ML IJ SUSP: 40.0000 mg | Freq: Once | INTRAMUSCULAR | Status: DC

## 2013-12-09 NOTE — Progress Notes (Signed)
[facility] [address], [place] [address] [phone]  Date:  12/09/2013   Name:  Janet Kramer   DOB:  [birth year]   MRN:  [id number]  PCP:  Robyn Haber, MD    Chief Complaint: Shoulder Pain   History of Present Illness:  Janet Kramer is a 69 y.o. very pleasant female patient who presents with the following:  No history of injury.  Has pain in the right shoulder that interferes with movement. Pain is in lateral shoulder. Non radiating  No neuro or weakness Pain worse with movement. Started after lifting a briefcase No improvement with over the counter medications or other home remedies. Denies other complaint or health concern today.    There are no active problems to display for this patient.   Past Medical History  Diagnosis Date  . Bronchitis     has recurrent bronchitis  . Allergy   . Arthritis   . Asthma   . Glaucoma   . Cataract   . Hyperlipidemia     Past Surgical History  Procedure Laterality Date  . Tonsillectomy  1949  . Fracture surgery  2001    ankle - right     . Eye surgery  1975    right    History  Substance Use Topics  . Smoking status: Never Smoker   . Smokeless tobacco: Never Used  . Alcohol Use: Yes     Comment: occasional    Family History  Problem Relation Age of Onset  . Cancer Mother     colon  . Colon cancer Mother 41  . Cancer Father     lung  . Diabetes Paternal Grandmother   . Stroke Maternal Grandmother   . Heart disease Maternal Grandfather   . Stomach cancer Neg Hx     Allergies  Allergen Reactions  . Codeine     Overly sleepy  . Oxycontin [Oxycodone Hcl] Nausea And Vomiting    Medication list has been reviewed and updated.  Current Outpatient Prescriptions on File Prior to Visit  Medication Sig Dispense Refill  . aspirin 325 MG tablet Take 325 mg by mouth daily.    Marland Kitchen latanoprost (XALATAN) 0.005 % ophthalmic solution Place 1 drop into both eyes at bedtime.    . Multiple  Vitamin (MULTIVITAMIN) tablet Take 1 tablet by mouth daily.     No current facility-administered medications on file prior to visit.    Review of Systems:  As per HPI, otherwise negative.    Physical Examination: Filed Vitals:   12/09/13 1314  BP: 122/64  Pulse: 84  Temp: 97.7 F (36.5 C)  Resp: 18   Filed Vitals:   12/09/13 1314  Height: 5\' 3"  (1.6 m)  Weight: 149 lb 12.8 oz (67.949 kg)   Body mass index is 26.54 kg/(m^2). Ideal Body Weight: Weight in (lb) to have BMI = 25: 140.8   GEN: WDWN, NAD, Non-toxic, Alert & Oriented x 3 HEENT: Atraumatic, Normocephalic.  Ears and Nose: No external deformity. EXTR: No clubbing/cyanosis/edema NEURO: Normal gait.  PSYCH: Normally interactive. Conversant. Not depressed or anxious appearing.  Calm demeanor.  Right shoulder:  Tender lateral shoulder won't abduct  Assessment and Plan: Shoulder bursitis Aleve Kenalog and marcaine  Signed,  Ellison Carwin, MD

## 2013-12-09 NOTE — Patient Instructions (Signed)
Bursitis °Bursitis is a swelling and soreness (inflammation) of a fluid-filled sac (bursa) that overlies and protects a joint. It can be caused by injury, overuse of the joint, arthritis or infection. The joints most likely to be affected are the elbows, shoulders, hips and knees. °HOME CARE INSTRUCTIONS  °· Apply ice to the affected area for 15-20 minutes each hour while awake for 2 days. Put the ice in a plastic bag and place a towel between the bag of ice and your skin. °· Rest the injured joint as much as possible, but continue to put the joint through a full range of motion, 4 times per day. (The shoulder joint especially becomes rapidly "frozen" if not used.) When the pain lessens, begin normal slow movements and usual activities. °· Only take over-the-counter or prescription medicines for pain, discomfort or fever as directed by your caregiver. °· Your caregiver may recommend draining the bursa and injecting medicine into the bursa. This may help the healing process. °· Follow all instructions for follow-up with your caregiver. This includes any orthopedic referrals, physical therapy and rehabilitation. Any delay in obtaining necessary care could result in a delay or failure of the bursitis to heal and chronic pain. °SEEK IMMEDIATE MEDICAL CARE IF:  °· Your pain increases even during treatment. °· You develop an oral temperature above 102° F (38.9° C) and have heat and inflammation over the involved bursa. °MAKE SURE YOU:  °· Understand these instructions. °· Will watch your condition. °· Will get help right away if you are not doing well or get worse. °Document Released: 12/28/1999 Document Revised: 03/24/2011 Document Reviewed: 03/21/2013 °Exit[employer]® Patient Information ©2015 [employer]. This information is not intended to replace advice given to you by your health care provider. Make sure you discuss any questions you have with your health care provider. ° °

## 2013-12-29 ENCOUNTER — Telehealth: Payer: Self-pay

## 2013-12-29 LAB — HM MAMMOGRAPHY

## 2013-12-29 NOTE — Telephone Encounter (Signed)
Spoke to patient's husband.  He is not sure if patient has had her flu shot or not, but he will remind her to get it.

## 2014-01-03 ENCOUNTER — Ambulatory Visit (INDEPENDENT_AMBULATORY_CARE_PROVIDER_SITE_OTHER): Payer: Commercial Managed Care - HMO | Admitting: Family Medicine

## 2014-01-03 VITALS — BP 128/82 | HR 88 | Temp 98.2°F | Resp 16 | Ht 63.0 in | Wt 150.6 lb

## 2014-01-03 DIAGNOSIS — R319 Hematuria, unspecified: Secondary | ICD-10-CM

## 2014-01-03 DIAGNOSIS — N3091 Cystitis, unspecified with hematuria: Secondary | ICD-10-CM

## 2014-01-03 LAB — POCT URINALYSIS DIPSTICK
BILIRUBIN UA: NEGATIVE
Glucose, UA: NEGATIVE
KETONES UA: NEGATIVE
Nitrite, UA: POSITIVE
PROTEIN UA: NEGATIVE
Spec Grav, UA: 1.015
Urobilinogen, UA: 0.2
pH, UA: 6.5

## 2014-01-03 LAB — POCT UA - MICROSCOPIC ONLY
Casts, Ur, LPF, POC: NEGATIVE
Crystals, Ur, HPF, POC: NEGATIVE
Mucus, UA: NEGATIVE
Yeast, UA: NEGATIVE

## 2014-01-03 MED ORDER — SULFAMETHOXAZOLE-TRIMETHOPRIM 800-160 MG PO TABS: 1.0000 | ORAL_TABLET | Freq: Two times a day (BID) | ORAL | Status: DC

## 2014-01-03 NOTE — Progress Notes (Signed)
Subjective:    Patient ID: Janet Kramer, female    DOB: October 19, 1945, 68 y.o.   MRN: [id number]  HPI Janet Kramer is a 68 y.o. female   Noticed blood in urine this morning, worsened during day - few small clots - strings of blood, then improved  Has had some discomfort with urination, urinary frequency today.    No fever/N/V/abd pain.  Few days ago noticed some lower twinge of back pain with leaning over to drawer, then resolved. No hx of kidney stones.  One twinge of pain in back pain that resolved with a nap.  No back pain now.   No attempted treatments.  Has been drinking fluids ok. Drinks Control and instrumentation engineer.    Has noticed trace blood on urine in past at physicals.  Was seen by urology and no specific workup was recommended then.   Took naproxen 7 times in November - some upset stomach then, not now.  Has stopped this medicine.   There are no active problems to display for this patient.  Past Medical History  Diagnosis Date  . Bronchitis     has recurrent bronchitis  . Allergy   . Arthritis   . Asthma   . Glaucoma   . Cataract   . Hyperlipidemia    Past Surgical History  Procedure Laterality Date  . Tonsillectomy  1949  . Fracture surgery  2001    ankle - right     . Eye surgery  1975    right   Allergies  Allergen Reactions  . Codeine     Overly sleepy  . Oxycontin [Oxycodone Hcl] Nausea And Vomiting   Prior to Admission medications   Medication Sig Start Date End Date Taking? Authorizing Provider  aspirin 325 MG tablet Take 325 mg by mouth daily.    Historical Provider, MD  latanoprost (XALATAN) 0.005 % ophthalmic solution Place 1 drop into both eyes at bedtime.    Historical Provider, MD  Multiple Vitamin (MULTIVITAMIN) tablet Take 1 tablet by mouth daily.    Historical Provider, MD   History   Social History  . Marital Status: Married    Spouse Name: N/A    Number of Children: N/A  . Years of Education: N/A   Occupational History  . tutor [employer]   Social History Main Topics  . Smoking status: Never Smoker   . Smokeless tobacco: Never Used  . Alcohol Use: Yes     Comment: occasional  . Drug Use: No  . Sexual Activity: Not on file   Other Topics Concern  . Not on file   Social History Narrative      Review of Systems  Constitutional: Negative for fever and chills.  Gastrointestinal: Positive for abdominal pain. Negative for nausea and vomiting.       Lower abdomen/bladder area  Genitourinary: Positive for dysuria, urgency, frequency and hematuria. Negative for vaginal bleeding, vaginal discharge, difficulty urinating, vaginal pain, menstrual problem and pelvic pain.  Musculoskeletal: Negative for back pain.       Objective:   Physical Exam  Constitutional: She is oriented to person, place, and time. She appears well-developed and well-nourished.  HENT:  Head: Normocephalic and atraumatic.  Pulmonary/Chest: Effort normal.  Abdominal: Soft. Normal appearance. She exhibits no distension. There is no tenderness (min ttp suprapubic. only, no rebound, no guarding. ). There is no rebound, no guarding and no CVA tenderness.  Neurological: She is alert and oriented to person, place, and  time.  Skin: Skin is warm.  Psychiatric: She has a normal mood and affect. Her behavior is normal.   Filed Vitals:   01/03/14 1844  BP: 128/82  Pulse: 88  Temp: 98.2 F (36.8 C)  TempSrc: Oral  Resp: 16  Height: 5\' 3"  (1.6 m)  Weight: 150 lb 9.6 oz (68.312 kg)  SpO2: 99%   Results for orders placed or performed in visit on 01/03/14  POCT UA - Microscopic Only  Result Value Ref Range   WBC, Ur, HPF, POC 25-35    RBC, urine, microscopic 30-35    Bacteria, U Microscopic 1+    Mucus, UA neg    Epithelial cells, urine per micros 2-4    Crystals, Ur, HPF, POC neg    Casts, Ur, LPF, POC neg    Yeast, UA neg   POCT urinalysis dipstick  Result Value Ref Range   Color, UA yellow    Clarity, UA cloudy    Glucose, UA neg      Bilirubin, UA neg    Ketones, UA neg    Spec Grav, UA 1.015    Blood, UA large    pH, UA 6.5    Protein, UA neg    Urobilinogen, UA 0.2    Nitrite, UA positive    Leukocytes, UA moderate (2+)         Assessment & Plan:   ALEXSUS Kramer is a 68 y.o. female Hematuria - Plan: POCT UA - Microscopic Only, POCT urinalysis dipstick, sulfamethoxazole-trimethoprim (BACTRIM DS) 800-160 MG per tablet, Urine culture  Hemorrhagic cystitis - Plan: sulfamethoxazole-trimethoprim (BACTRIM DS) 800-160 MG per tablet, Urine culture  -start septra - SED and RTC precautions. Sx care with fluids and h/o below.  Can recheck urine at next physical but hx of trace blood and prior urology workup.    Meds ordered this encounter  Medications  . sulfamethoxazole-trimethoprim (BACTRIM DS) 800-160 MG per tablet    Sig: Take 1 tablet by mouth 2 (two) times daily.    Dispense:  10 tablet    Refill:  0   Patient Instructions  You should receive a call or letter about your lab results within the next week to 10 days.     Urinary Tract Infection Urinary tract infections (UTIs) can develop anywhere along your urinary tract. Your urinary tract is your body's drainage system for removing wastes and extra water. Your urinary tract includes two kidneys, two ureters, a bladder, and a urethra. Your kidneys are a pair of bean-shaped organs. Each kidney is about the size of your fist. They are located below your ribs, one on each side of your spine. CAUSES Infections are caused by microbes, which are microscopic organisms, including fungi, viruses, and bacteria. These organisms are so small that they can only be seen through a microscope. Bacteria are the microbes that most commonly cause UTIs. SYMPTOMS  Symptoms of UTIs may vary by age and gender of the patient and by the location of the infection. Symptoms in young women typically include a frequent and intense urge to urinate and a painful, burning feeling in the  bladder or urethra during urination. Older women and men are more likely to be tired, shaky, and weak and have muscle aches and abdominal pain. A fever may mean the infection is in your kidneys. Other symptoms of a kidney infection include pain in your back or sides below the ribs, nausea, and vomiting. DIAGNOSIS To diagnose a UTI, your caregiver will ask you  about your symptoms. Your caregiver also will ask to provide a urine sample. The urine sample will be tested for bacteria and white blood cells. White blood cells are made by your body to help fight infection. TREATMENT  Typically, UTIs can be treated with medication. Because most UTIs are caused by a bacterial infection, they usually can be treated with the use of antibiotics. The choice of antibiotic and length of treatment depend on your symptoms and the type of bacteria causing your infection. HOME CARE INSTRUCTIONS  If you were prescribed antibiotics, take them exactly as your caregiver instructs you. Finish the medication even if you feel better after you have only taken some of the medication.  Drink enough water and fluids to keep your urine clear or pale yellow.  Avoid caffeine, tea, and carbonated beverages. They tend to irritate your bladder.  Empty your bladder often. Avoid holding urine for long periods of time.  Empty your bladder before and after sexual intercourse.  After a bowel movement, women should cleanse from front to back. Use each tissue only once. SEEK MEDICAL CARE IF:   You have back pain.  You develop a fever.  Your symptoms do not begin to resolve within 3 days. SEEK IMMEDIATE MEDICAL CARE IF:   You have severe back pain or lower abdominal pain.  You develop chills.  You have nausea or vomiting.  You have continued burning or discomfort with urination. MAKE SURE YOU:   Understand these instructions.  Will watch your condition.  Will get help right away if you are not doing well or get  worse. Document Released: 10/09/2004 Document Revised: 07/01/2011 Document Reviewed: 02/07/2011 [facility] Patient Information 2015 [place], [place]. This information is not intended to replace advice given to you by your health care provider. Make sure you discuss any questions you have with your health care provider.  Hematuria Hematuria is blood in your urine. It can be caused by a bladder infection, kidney infection, prostate infection, kidney stone, or cancer of your urinary tract. Infections can usually be treated with medicine, and a kidney stone usually will pass through your urine. If neither of these is the cause of your hematuria, further workup to find out the reason may be needed. It is very important that you tell your health care provider about any blood you see in your urine, even if the blood stops without treatment or happens without causing pain. Blood in your urine that happens and then stops and then happens again can be a symptom of a very serious condition. Also, pain is not a symptom in the initial stages of many urinary cancers. HOME CARE INSTRUCTIONS   Drink lots of fluid, 3-4 quarts a day. If you have been diagnosed with an infection, cranberry juice is especially recommended, in addition to large amounts of water.  Avoid caffeine, tea, and carbonated beverages because they tend to irritate the bladder.  Avoid alcohol because it may irritate the prostate.  Take all medicines as directed by your health care provider.  If you were prescribed an antibiotic medicine, finish it all even if you start to feel better.  If you have been diagnosed with a kidney stone, follow your health care provider's instructions regarding straining your urine to catch the stone.  Empty your bladder often. Avoid holding urine for long periods of time.  After a bowel movement, women should cleanse front to back. Use each tissue only once.  Empty your bladder before and after sexual intercourse  if  you are a female. SEEK MEDICAL CARE IF:  You develop back pain.  You have a fever.  You have a feeling of sickness in your stomach (nausea) or vomiting.  Your symptoms are not better in 3 days. Return sooner if you are getting worse. SEEK IMMEDIATE MEDICAL CARE IF:   You develop severe vomiting and are unable to keep the medicine down.  You develop severe back or abdominal pain despite taking your medicines.  You begin passing a large amount of blood or clots in your urine.  You feel extremely weak or faint, or you pass out. MAKE SURE YOU:   Understand these instructions.  Will watch your condition.  Will get help right away if you are not doing well or get worse. Document Released: 12/30/2004 Document Revised: 05/16/2013 Document Reviewed: 08/30/2012 [facility] Patient Information 2015 [place], [place]. This information is not intended to replace advice given to you by your health care provider. Make sure you discuss any questions you have with your health care provider.

## 2014-01-03 NOTE — Patient Instructions (Signed)
You should receive a call or letter about your lab results within the next week to 10 days.     Urinary Tract Infection Urinary tract infections (UTIs) can develop anywhere along your urinary tract. Your urinary tract is your body's drainage system for removing wastes and extra water. Your urinary tract includes two kidneys, two ureters, a bladder, and a urethra. Your kidneys are a pair of bean-shaped organs. Each kidney is about the size of your fist. They are located below your ribs, one on each side of your spine. CAUSES Infections are caused by microbes, which are microscopic organisms, including fungi, viruses, and bacteria. These organisms are so small that they can only be seen through a microscope. Bacteria are the microbes that most commonly cause UTIs. SYMPTOMS  Symptoms of UTIs may vary by age and gender of the patient and by the location of the infection. Symptoms in young women typically include a frequent and intense urge to urinate and a painful, burning feeling in the bladder or urethra during urination. Older women and men are more likely to be tired, shaky, and weak and have muscle aches and abdominal pain. A fever may mean the infection is in your kidneys. Other symptoms of a kidney infection include pain in your back or sides below the ribs, nausea, and vomiting. DIAGNOSIS To diagnose a UTI, your caregiver will ask you about your symptoms. Your caregiver also will ask to provide a urine sample. The urine sample will be tested for bacteria and white blood cells. White blood cells are made by your body to help fight infection. TREATMENT  Typically, UTIs can be treated with medication. Because most UTIs are caused by a bacterial infection, they usually can be treated with the use of antibiotics. The choice of antibiotic and length of treatment depend on your symptoms and the type of bacteria causing your infection. HOME CARE INSTRUCTIONS  If you were prescribed antibiotics, take them  exactly as your caregiver instructs you. Finish the medication even if you feel better after you have only taken some of the medication.  Drink enough water and fluids to keep your urine clear or pale yellow.  Avoid caffeine, tea, and carbonated beverages. They tend to irritate your bladder.  Empty your bladder often. Avoid holding urine for long periods of time.  Empty your bladder before and after sexual intercourse.  After a bowel movement, women should cleanse from front to back. Use each tissue only once. SEEK MEDICAL CARE IF:   You have back pain.  You develop a fever.  Your symptoms do not begin to resolve within 3 days. SEEK IMMEDIATE MEDICAL CARE IF:   You have severe back pain or lower abdominal pain.  You develop chills.  You have nausea or vomiting.  You have continued burning or discomfort with urination. MAKE SURE YOU:   Understand these instructions.  Will watch your condition.  Will get help right away if you are not doing well or get worse. Document Released: 10/09/2004 Document Revised: 07/01/2011 Document Reviewed: 02/07/2011 [facility] Patient Information 2015 [place], [place]. This information is not intended to replace advice given to you by your health care provider. Make sure you discuss any questions you have with your health care provider.  Hematuria Hematuria is blood in your urine. It can be caused by a bladder infection, kidney infection, prostate infection, kidney stone, or cancer of your urinary tract. Infections can usually be treated with medicine, and a kidney stone usually will pass through your urine.  If neither of these is the cause of your hematuria, further workup to find out the reason may be needed. It is very important that you tell your health care provider about any blood you see in your urine, even if the blood stops without treatment or happens without causing pain. Blood in your urine that happens and then stops and then happens again  can be a symptom of a very serious condition. Also, pain is not a symptom in the initial stages of many urinary cancers. HOME CARE INSTRUCTIONS   Drink lots of fluid, 3-4 quarts a day. If you have been diagnosed with an infection, cranberry juice is especially recommended, in addition to large amounts of water.  Avoid caffeine, tea, and carbonated beverages because they tend to irritate the bladder.  Avoid alcohol because it may irritate the prostate.  Take all medicines as directed by your health care provider.  If you were prescribed an antibiotic medicine, finish it all even if you start to feel better.  If you have been diagnosed with a kidney stone, follow your health care provider's instructions regarding straining your urine to catch the stone.  Empty your bladder often. Avoid holding urine for long periods of time.  After a bowel movement, women should cleanse front to back. Use each tissue only once.  Empty your bladder before and after sexual intercourse if you are a female. SEEK MEDICAL CARE IF:  You develop back pain.  You have a fever.  You have a feeling of sickness in your stomach (nausea) or vomiting.  Your symptoms are not better in 3 days. Return sooner if you are getting worse. SEEK IMMEDIATE MEDICAL CARE IF:   You develop severe vomiting and are unable to keep the medicine down.  You develop severe back or abdominal pain despite taking your medicines.  You begin passing a large amount of blood or clots in your urine.  You feel extremely weak or faint, or you pass out. MAKE SURE YOU:   Understand these instructions.  Will watch your condition.  Will get help right away if you are not doing well or get worse. Document Released: 12/30/2004 Document Revised: 05/16/2013 Document Reviewed: 08/30/2012 [facility] Patient Information 2015 [place], [place]. This information is not intended to replace advice given to you by your health care provider. Make sure you  discuss any questions you have with your health care provider.

## 2014-01-06 LAB — URINE CULTURE: Colony Count: >=100000

## 2014-02-18 ENCOUNTER — Ambulatory Visit (INDEPENDENT_AMBULATORY_CARE_PROVIDER_SITE_OTHER): Payer: Commercial Managed Care - HMO | Admitting: Internal Medicine

## 2014-02-18 ENCOUNTER — Ambulatory Visit (INDEPENDENT_AMBULATORY_CARE_PROVIDER_SITE_OTHER): Payer: Commercial Managed Care - HMO

## 2014-02-18 VITALS — BP 132/78 | HR 75 | Temp 97.5°F | Resp 16 | Ht 63.0 in | Wt 152.0 lb

## 2014-02-18 DIAGNOSIS — M25511 Pain in right shoulder: Secondary | ICD-10-CM

## 2014-02-18 DIAGNOSIS — M75101 Unspecified rotator cuff tear or rupture of right shoulder, not specified as traumatic: Secondary | ICD-10-CM

## 2014-02-18 DIAGNOSIS — H8110 Benign paroxysmal vertigo, unspecified ear: Secondary | ICD-10-CM

## 2014-02-18 MED ORDER — TRAMADOL HCL 50 MG PO TABS: ORAL_TABLET | ORAL | Status: DC

## 2014-02-18 NOTE — Progress Notes (Signed)
Subjective:  This chart was scribed for Tami Lin, MD by Dellis Filbert, ED Scribe at [facility].The patient was seen in exam room 08 and the patient's care was started at 12:57 PM.   Patient ID: Janet Kramer, female    DOB: Jun 19, 1945, 69 y.o.   MRN: [id number] Chief Complaint  Patient presents with  . Dizziness    started x1 week   HPI HPI Comments: Janet Kramer is a 69 y.o. female who presents to [facility] complaining of ongoing right shoulder pain. She was seen here on 12/09/2013  and diagnosed with shoulder bursitis. Pt was given a 40 mg kenalog injection Dr Ouida Sills. Pt states the pain has "returned with a vengeance". She believes it is tendinitis as she has full ROM of her right shoulder. She developed the pain initially when she lifted a heavy piece of luggage over a chair--she felt a pop in the shoulder. The pain radiates down her right arm and her right shoulder is more swollen than left. 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 Certain movements give her pain.   Pt also vertigo, began one week ago. Pt got up to get ready for work and felt lightheaded. Pt states she "lurched" to the left twice. The vertigo quickly passed Has a Hx of low blood pressure. Changing positions increases the likelihood of the vertigo episodes. When she lays down and closes her eyes she states it feels like "a rocking back and forth".No nausea, change in her heart rate associated with the vertigo. No new medications. Takes aspirin, calcium supplements and a multivitamin,       There are no active problems to display for this patient.  Past Medical History  Diagnosis Date  . Bronchitis     has recurrent bronchitis  . Allergy   . Arthritis   . Asthma   . Glaucoma   . Cataract   . Hyperlipidemia    Past Surgical History  Procedure Laterality Date  . Tonsillectomy  1949  . Fracture surgery  2001    ankle - right     . Eye surgery  1975    right   Allergies    Allergen Reactions  . Codeine     Overly sleepy  . Oxycontin [Oxycodone Hcl] Nausea And Vomiting   Prior to Admission medications   Medication Sig Start Date End Date Taking? Authorizing Provider  aspirin 325 MG tablet Take 325 mg by mouth daily.   Yes Historical Provider, MD  Cholecalciferol (VITAMIN D PO) Take by mouth.   Yes Historical Provider, MD  Multiple Vitamin (MULTIVITAMIN) tablet Take 1 tablet by mouth daily.   Yes Historical Provider, MD   History   Social History  . Marital Status: Married    Spouse Name: N/A    Number of Children: N/A  . Years of Education: N/A   Occupational History  . tutor [employer]   Social History Main Topics  . Smoking status: Never Smoker   . Smokeless tobacco: Never Used  . Alcohol Use: Yes     Comment: occasional  . Drug Use: No  . Sexual Activity: Not on file   Other Topics Concern  . Not on file   Social History Narrative   Review of Systems No fever chills or night sweats No weight loss No vision difficulties No palpitations/no diaphoresis No tremor No memory changes No sleep issues except for the being interrupted recently by pain  Objective:  BP 132/78 mmHg  Pulse 75  Temp(Src) 97.5 F (36.4 C) (Oral)  Resp 16  Ht 5\' 3"  (1.6 m)  Wt 152 lb (68.947 kg)  BMI 26.93 kg/m2  SpO2 100%  Physical Exam  Constitutional: She is oriented to person, place, and time. She appears well-developed and well-nourished. No distress.  HENT:  Head: Normocephalic and atraumatic.  Right Ear: External ear normal.  Left Ear: External ear normal.  Nose: Nose normal.  Mouth/Throat: Oropharynx is clear and moist.  Eyes: Conjunctivae and EOM are normal. Pupils are equal, round, and reactive to light.  Neck: Normal range of motion. Neck supple. No thyromegaly present.  Cardiovascular: Normal rate, regular rhythm and normal heart sounds.   Pulmonary/Chest: Effort normal. No respiratory distress.  Musculoskeletal: Normal  range of motion.  The right anterior shoulder is TPP with pain on resisted AB duction and AD duction.  Tenderness is mainly in the bicipital groove and anterior shoulder   Lymphadenopathy:    She has no cervical adenopathy.  Neurological: She is alert and oriented to person, place, and time. She has normal reflexes. She displays normal reflexes. No cranial nerve deficit. She exhibits normal muscle tone. Coordination normal.  Hallpike Dix maneuver produces very mild sensation of vertigo but no nystagmus. The sensation resolved within seconds The cerebellar exam is negative  Skin: Skin is warm and dry.  Psychiatric: She has a normal mood and affect. Her behavior is normal. Thought content normal.  Nursing note and vitals reviewed.  [facility] reading (PRIMARY) by  Dr. Laney Pastor normal shoulder x-ray         Assessment & Plan:  Pain in joint, shoulder region, right -? Rotator cuff tear, right  The exam suggests some impingement type problem although the history fits more with a rotator cuff tear Will refer to Dr Delilah Shan to see if [place] can make a dx meds to sleep if needed  BPV (benign positional vertigo), unspecified laterality  Nestor Lewandowsky manuvers if symptomatic again    Meds ordered this encounter  Medications  . traMADol (ULTRAM) 50 MG tablet    Sig: 1-2 at hs for pain    Dispense:  30 tablet    Refill:  0      I have completed the patient encounter in its entirety as documented by the scribe, with editing by me where necessary. Robert P. Laney Pastor, M.D.

## 2014-03-02 DIAGNOSIS — M75101 Unspecified rotator cuff tear or rupture of right shoulder, not specified as traumatic: Secondary | ICD-10-CM | POA: Diagnosis not present

## 2014-03-07 DIAGNOSIS — H4011X1 Primary open-angle glaucoma, mild stage: Secondary | ICD-10-CM | POA: Diagnosis not present

## 2014-03-07 DIAGNOSIS — H2513 Age-related nuclear cataract, bilateral: Secondary | ICD-10-CM | POA: Diagnosis not present

## 2014-03-08 DIAGNOSIS — M25511 Pain in right shoulder: Secondary | ICD-10-CM | POA: Diagnosis not present

## 2014-03-13 DIAGNOSIS — M75101 Unspecified rotator cuff tear or rupture of right shoulder, not specified as traumatic: Secondary | ICD-10-CM | POA: Diagnosis not present

## 2014-03-31 DIAGNOSIS — H0011 Chalazion right upper eyelid: Secondary | ICD-10-CM | POA: Diagnosis not present

## 2014-04-06 DIAGNOSIS — S46011A Strain of muscle(s) and tendon(s) of the rotator cuff of right shoulder, initial encounter: Secondary | ICD-10-CM | POA: Diagnosis not present

## 2014-04-06 DIAGNOSIS — M7541 Impingement syndrome of right shoulder: Secondary | ICD-10-CM | POA: Diagnosis not present

## 2014-04-06 DIAGNOSIS — G8918 Other acute postprocedural pain: Secondary | ICD-10-CM | POA: Diagnosis not present

## 2014-04-06 DIAGNOSIS — Y929 Unspecified place or not applicable: Secondary | ICD-10-CM | POA: Diagnosis not present

## 2014-04-06 DIAGNOSIS — X58XXXA Exposure to other specified factors, initial encounter: Secondary | ICD-10-CM | POA: Diagnosis not present

## 2014-04-06 DIAGNOSIS — S43421A Sprain of right rotator cuff capsule, initial encounter: Secondary | ICD-10-CM | POA: Diagnosis not present

## 2014-04-06 DIAGNOSIS — M24111 Other articular cartilage disorders, right shoulder: Secondary | ICD-10-CM | POA: Diagnosis not present

## 2014-04-19 DIAGNOSIS — M25511 Pain in right shoulder: Secondary | ICD-10-CM | POA: Diagnosis not present

## 2014-05-17 DIAGNOSIS — M75101 Unspecified rotator cuff tear or rupture of right shoulder, not specified as traumatic: Secondary | ICD-10-CM | POA: Diagnosis not present

## 2014-06-20 DIAGNOSIS — M25511 Pain in right shoulder: Secondary | ICD-10-CM | POA: Diagnosis not present

## 2014-06-20 DIAGNOSIS — M7021 Olecranon bursitis, right elbow: Secondary | ICD-10-CM | POA: Diagnosis not present

## 2014-06-22 ENCOUNTER — Encounter: Payer: Self-pay | Admitting: *Deleted

## 2014-06-23 ENCOUNTER — Telehealth: Payer: Self-pay | Admitting: *Deleted

## 2014-06-23 ENCOUNTER — Encounter: Payer: Self-pay | Admitting: *Deleted

## 2014-06-23 NOTE — Telephone Encounter (Signed)
Received mammo/ultrasound from [place] dated for 12/29/2013 - updated health maintenance & abstracted.

## 2014-07-05 DIAGNOSIS — M25511 Pain in right shoulder: Secondary | ICD-10-CM | POA: Diagnosis not present

## 2014-08-10 DIAGNOSIS — H2513 Age-related nuclear cataract, bilateral: Secondary | ICD-10-CM | POA: Diagnosis not present

## 2014-08-18 ENCOUNTER — Telehealth: Payer: Self-pay

## 2014-08-18 DIAGNOSIS — H269 Unspecified cataract: Secondary | ICD-10-CM

## 2014-08-18 NOTE — Telephone Encounter (Signed)
Please advise 

## 2014-08-18 NOTE — Telephone Encounter (Signed)
Patient called to request a referral to a surgeon for cataract surgery.  She hasn't been seen in our office for eye-related problems, so I am unsure if she needs to be seen about her eyes before the referral can be made.  Please contact the patient.  [phone]

## 2014-08-18 NOTE — Telephone Encounter (Signed)
Referral to Dr. Katy Fitch placed. Thank you!

## 2014-08-21 NOTE — Telephone Encounter (Signed)
She would like to see Dr. Bing Plume not Dr. Katy Fitch. The surgery is already scheduled. Please change.

## 2014-08-22 DIAGNOSIS — H2513 Age-related nuclear cataract, bilateral: Secondary | ICD-10-CM | POA: Diagnosis not present

## 2014-08-22 DIAGNOSIS — H524 Presbyopia: Secondary | ICD-10-CM | POA: Diagnosis not present

## 2014-08-22 DIAGNOSIS — H2512 Age-related nuclear cataract, left eye: Secondary | ICD-10-CM | POA: Diagnosis not present

## 2014-09-25 DIAGNOSIS — H2512 Age-related nuclear cataract, left eye: Secondary | ICD-10-CM | POA: Diagnosis not present

## 2014-09-25 DIAGNOSIS — H25812 Combined forms of age-related cataract, left eye: Secondary | ICD-10-CM | POA: Diagnosis not present

## 2014-10-09 DIAGNOSIS — H2511 Age-related nuclear cataract, right eye: Secondary | ICD-10-CM | POA: Diagnosis not present

## 2014-10-09 DIAGNOSIS — H25811 Combined forms of age-related cataract, right eye: Secondary | ICD-10-CM | POA: Diagnosis not present

## 2015-02-22 ENCOUNTER — Encounter: Payer: Self-pay | Admitting: Family Medicine

## 2015-03-27 ENCOUNTER — Encounter: Payer: Self-pay | Admitting: Family Medicine

## 2015-04-20 ENCOUNTER — Telehealth: Payer: Self-pay

## 2015-04-20 NOTE — Telephone Encounter (Signed)
[place] [id number]

## 2015-04-20 NOTE — Telephone Encounter (Signed)
Whitney from [facility] is calling to request a [facility] referral. The appointment is April 11th. Diagnoses code: H25.13 NPI: [id number]

## 2015-04-24 DIAGNOSIS — Z961 Presence of intraocular lens: Secondary | ICD-10-CM | POA: Diagnosis not present

## 2015-04-24 DIAGNOSIS — H401131 Primary open-angle glaucoma, bilateral, mild stage: Secondary | ICD-10-CM | POA: Diagnosis not present

## 2015-10-26 ENCOUNTER — Telehealth: Payer: Self-pay | Admitting: *Deleted

## 2015-10-26 DIAGNOSIS — H409 Unspecified glaucoma: Secondary | ICD-10-CM

## 2015-10-26 NOTE — Telephone Encounter (Signed)
Dr. Bing Plume needs a referral again for patient 6 month follow up because she has humana.  Order is pended.  After order is placed please send to referrals to precert.

## 2015-10-26 NOTE — Telephone Encounter (Signed)
Orders Placed This Encounter  Procedures  . Ambulatory referral to Ophthalmology    Referral Priority:   Routine    Referral Type:   Consultation    Referral Reason:   Specialty Services Required    Requested Specialty:   Ophthalmology    Number of Visits Requested:   1    

## 2015-10-29 NOTE — Telephone Encounter (Signed)
Pre-auth has been sent over! Thanks!

## 2015-11-01 DIAGNOSIS — H401131 Primary open-angle glaucoma, bilateral, mild stage: Secondary | ICD-10-CM | POA: Diagnosis not present

## 2016-01-09 ENCOUNTER — Encounter: Payer: Self-pay | Admitting: Family Medicine

## 2016-01-16 ENCOUNTER — Encounter: Payer: Self-pay | Admitting: Family Medicine

## 2016-01-16 ENCOUNTER — Ambulatory Visit (INDEPENDENT_AMBULATORY_CARE_PROVIDER_SITE_OTHER): Payer: Commercial Managed Care - HMO | Admitting: Family Medicine

## 2016-01-16 VITALS — BP 121/71 | HR 88 | Temp 98.8°F | Resp 16 | Ht 63.0 in | Wt 151.0 lb

## 2016-01-16 DIAGNOSIS — Z1159 Encounter for screening for other viral diseases: Secondary | ICD-10-CM

## 2016-01-16 DIAGNOSIS — Z0000 Encounter for general adult medical examination without abnormal findings: Secondary | ICD-10-CM

## 2016-01-16 DIAGNOSIS — Z23 Encounter for immunization: Secondary | ICD-10-CM | POA: Diagnosis not present

## 2016-01-16 DIAGNOSIS — N760 Acute vaginitis: Secondary | ICD-10-CM

## 2016-01-16 DIAGNOSIS — N898 Other specified noninflammatory disorders of vagina: Secondary | ICD-10-CM

## 2016-01-16 DIAGNOSIS — B9689 Other specified bacterial agents as the cause of diseases classified elsewhere: Secondary | ICD-10-CM

## 2016-01-16 DIAGNOSIS — J3089 Other allergic rhinitis: Secondary | ICD-10-CM

## 2016-01-16 DIAGNOSIS — Z131 Encounter for screening for diabetes mellitus: Secondary | ICD-10-CM

## 2016-01-16 DIAGNOSIS — E7800 Pure hypercholesterolemia, unspecified: Secondary | ICD-10-CM

## 2016-01-16 LAB — POCT URINALYSIS DIP (MANUAL ENTRY)
Glucose, UA: NEGATIVE
NITRITE UA: NEGATIVE
PH UA: 5
PROTEIN UA: NEGATIVE
Spec Grav, UA: 1.02
UROBILINOGEN UA: 0.2

## 2016-01-16 MED ORDER — FLUTICASONE PROPIONATE 50 MCG/ACT NA SUSP: 2.0000 | Freq: Every day | NASAL | 6 refills | Status: DC

## 2016-01-16 NOTE — Patient Instructions (Addendum)
   IF you received an x-ray today, you will receive an invoice from [facility]. Please contact  Radiology at [phone] with questions or concerns regarding your invoice.   IF you received labwork today, you will receive an invoice from [facility]. Please contact [facility] at [phone] with questions or concerns regarding your invoice.   Our billing staff will not be able to assist you with questions regarding bills from these companies.  You will be contacted with the lab results as soon as they are available. The fastest way to get your results is to activate your My Chart account. Instructions are located on the last page of this paperwork. If you have not heard from us regarding the results in 2 weeks, please contact this office.     Keeping You Healthy  Get These Tests  Blood Pressure- Have your blood pressure checked by your healthcare provider at least once a year.  Normal blood pressure is 120/80.  Weight- Have your body mass index (BMI) calculated to screen for obesity.  BMI is a measure of body fat based on height and weight.  You can calculate your own BMI at www.nhlbisupport.com/bmi/  Cholesterol- Have your cholesterol checked every year.  Diabetes- Have your blood sugar checked every year if you have high blood pressure, high cholesterol, a family history of diabetes or if you are overweight.  Pap Test - Have a pap test every 1 to 5 years if you have been sexually active.  If you are older than 65 and recent pap tests have been normal you may not need additional pap tests.  In addition, if you have had a hysterectomy  for benign disease additional pap tests are not necessary.  Mammogram-Yearly mammograms are essential for early detection of breast cancer  Screening for Colon Cancer- Colonoscopy starting at age 50. Screening may begin sooner depending on your family history and other health conditions.  Follow up colonoscopy as directed by your  Gastroenterologist.  Screening for Osteoporosis- Screening begins at age 65 with bone density scanning, sooner if you are at higher risk for developing Osteoporosis.  Get these medicines  Calcium with Vitamin D- Your body requires 1200-1500 mg of Calcium a day and 800-1000 IU of Vitamin D a day.  You can only absorb 500 mg of Calcium at a time therefore Calcium must be taken in 2 or 3 separate doses throughout the day.  Hormones- Hormone therapy has been associated with increased risk for certain cancers and heart disease.  Talk to your healthcare provider about if you need relief from menopausal symptoms.  Aspirin- Ask your healthcare provider about taking Aspirin to prevent Heart Disease and Stroke.  Get these Immuniztions  Flu shot- Every fall  Pneumonia shot- Once after the age of 65; if you are younger ask your healthcare provider if you need a pneumonia shot.  Tetanus- Every ten years.  Zostavax- Once after the age of 60 to prevent shingles.  Take these steps  Don't smoke- Your healthcare provider can help you quit. For tips on how to quit, ask your healthcare provider or go to www.smokefree.gov or call 1-800 QUIT-NOW.  Be physically active- Exercise 5 days a week for a minimum of 30 minutes.  If you are not already physically active, start slow and gradually work up to 30 minutes of moderate physical activity.  Try walking, dancing, bike riding, swimming, etc.  Eat a healthy diet- Eat a variety of healthy foods such as fruits, vegetables, whole grains, low   fat milk, low fat cheeses, yogurt, lean meats, chicken, fish, eggs, dried beans, tofu, etc.  For more information go to www.thenutritionsource.org  Dental visit- Brush and floss teeth twice daily; visit your dentist twice a year.  Eye exam- Visit your Optometrist or Ophthalmologist yearly.  Drink alcohol in moderation- Limit alcohol intake to one drink or less a day.  Never drink and drive.  Depression- Your emotional  health is as important as your physical health.  If you're feeling down or losing interest in things you normally enjoy, please talk to your healthcare provider.  Seat Belts- can save your life; always wear one  Smoke/Carbon Monoxide detectors- These detectors need to be installed on the appropriate level of your home.  Replace batteries at least once a year.  Violence- If anyone is threatening or hurting you, please tell your healthcare provider.  Living Will/ Health care power of attorney- Discuss with your healthcare provider and family.  

## 2016-01-16 NOTE — Progress Notes (Signed)
Subjective:    Patient ID: Janet Kramer, female    DOB: 16-Aug-1945, 71 y.o.   MRN: [id number]  01/16/2016  Annual Exam   HPI This 71 y.o. female presents for Annual Wellness Examination and chronic follow-up.  Last physical:  2015 Pap smear: n/a Mammogram: 06/2013;  Colonoscopy: 2014 Bone density:  Gynecologist office four years ago; mild osteopenia. Family hx Is strong until 88s.  Ankle fracture fifteen years ago when getting ready for work; slipped on steps.    Immunization History  Administered Date(s) Administered  . Pneumococcal Conjugate-13 01/16/2016   BP Readings from Last 3 Encounters:  01/16/16 121/71  02/18/14 132/78  01/03/14 128/82   Wt Readings from Last 3 Encounters:  01/16/16 151 lb (68.5 kg)  02/18/14 152 lb (68.9 kg)  01/03/14 150 lb 9.6 oz (68.3 kg)   Chronic vaginal discharge: onset for years; s/p extensive gynecological exma in past; prescribed medication; prescribed estrogen  Caused itching.  No other medications.  Review of Systems  Constitutional: Negative for activity change, appetite change, chills, diaphoresis, fatigue, fever and unexpected weight change.  HENT: Negative for congestion, dental problem, drooling, ear discharge, ear pain, facial swelling, hearing loss, mouth sores, nosebleeds, postnasal drip, rhinorrhea, sinus pressure, sneezing, sore throat, tinnitus, trouble swallowing and voice change.   Eyes: Negative for photophobia, pain, discharge, redness, itching and visual disturbance.  Respiratory: Negative for apnea, cough, choking, chest tightness, shortness of breath, wheezing and stridor.   Cardiovascular: Negative for chest pain, palpitations and leg swelling.  Gastrointestinal: Negative for abdominal distention, abdominal pain, anal bleeding, blood in stool, constipation, diarrhea, nausea, rectal pain and vomiting.  Endocrine: Negative for cold intolerance, heat intolerance, polydipsia, polyphagia and polyuria.  Genitourinary: Positive  for vaginal discharge. Negative for decreased urine volume, difficulty urinating, dyspareunia, dysuria, enuresis, flank pain, frequency, genital sores, hematuria, menstrual problem, pelvic pain, urgency, vaginal bleeding and vaginal pain.       Nocturia x 1.  No urinary leakage  Musculoskeletal: Negative for arthralgias, back pain, gait problem, joint swelling, myalgias, neck pain and neck stiffness.  Skin: Negative for color change, pallor, rash and wound.  Allergic/Immunologic: Negative for environmental allergies, food allergies and immunocompromised state.  Neurological: Negative for dizziness, tremors, seizures, syncope, facial asymmetry, speech difficulty, weakness, light-headedness, numbness and headaches.  Hematological: Negative for adenopathy. Does not bruise/bleed easily.  Psychiatric/Behavioral: Negative for agitation, behavioral problems, confusion, decreased concentration, dysphoric mood, hallucinations, self-injury, sleep disturbance and suicidal ideas. The patient is not nervous/anxious and is not hyperactive.     Past Medical History:  Diagnosis Date  . Allergy   . Arthritis   . Asthma   . Bronchitis    has recurrent bronchitis  . Cataract   . Glaucoma   . Hyperlipidemia    Past Surgical History:  Procedure Laterality Date  . [place]   right  . FRACTURE SURGERY  2001   ankle - right     . ROTATOR CUFF REPAIR    . TONSILLECTOMY  1949   Allergies  Allergen Reactions  . Codeine     Overly sleepy  . Oxycontin [Oxycodone Hcl] Nausea And Vomiting   Current Outpatient Prescriptions  Medication Sig Dispense Refill  . aspirin 325 MG tablet Take 325 mg by mouth daily.    Marland Kitchen latanoprost (XALATAN) 0.005 % ophthalmic solution 1 drop at bedtime.    . fluticasone (FLONASE) 50 MCG/ACT nasal spray Place 2 sprays into both nostrils daily. 16 g 6  . metroNIDAZOLE (  METROGEL) 0.75 % vaginal gel Place 1 Applicatorful vaginally 2 (two) times daily. 70 g 0   Current  Facility-Administered Medications  Medication Dose Route Frequency Provider Last Rate Last Dose  . triamcinolone acetonide (KENALOG-40) injection 40 mg  40 mg Intramuscular Once Roselee Culver, MD       Social History   Social History  . Marital status: Married    Spouse name: N/A  . Number of children: 2  . Years of education: N/A   Occupational History  . [employer]   Social History Main Topics  . Smoking status: Never Smoker  . Smokeless tobacco: Never Used  . Alcohol use Yes     Comment: occasional  . Drug use: No  . Sexual activity: Not on file   Other Topics Concern  . 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      ADLs: independent with ADLs.      Advanced Directives: NO; FULL CODE; no prolonged measures; HCPOA: daughter not husband; [name].   Family History  Problem Relation Age of Onset  . Cancer Mother     colon  . Colon cancer Mother 38  . Cancer Father     lung  . Diabetes Paternal Grandmother   . Stroke Maternal Grandmother   . Heart disease Maternal Grandfather   . Stomach cancer Neg Hx        Objective:    BP 121/71   Pulse 88   Temp 98.8 F (37.1 C) (Oral)   Resp 16   Ht 5\' 3"  (1.6 m)   Wt 151 lb (68.5 kg)   BMI 26.75 kg/m  Physical Exam  Constitutional: She is oriented to person, place, and time. She appears well-developed and well-nourished. No distress.  HENT:  Head: Normocephalic and atraumatic.  Right Ear: External ear normal.  Left Ear: External ear normal.  Nose: Nose normal.  Mouth/Throat: Oropharynx is clear and moist.  Eyes: Conjunctivae and EOM are normal. Pupils are equal,  round, and reactive to light.  Neck: Normal range of motion and full passive range of motion without pain. Neck supple. No JVD present. Carotid bruit is not present. No thyromegaly present.  Cardiovascular: Normal rate, regular rhythm, normal heart sounds and intact distal pulses.  Exam reveals no gallop and no friction rub.   No murmur heard. Pulmonary/Chest: Effort normal and breath sounds normal. She has no wheezes. She has no rales. Right breast exhibits no inverted nipple, no mass, no nipple discharge, no skin change and no tenderness. Left breast exhibits no inverted nipple, no mass, no nipple discharge, no skin change and no tenderness.  Abdominal: Soft. Bowel sounds are normal. She exhibits no distension and no mass. There is no tenderness. There is no rebound and no guarding.  Genitourinary: Uterus normal. There is no rash, tenderness or lesion on the right labia. There is no rash, tenderness or lesion on the left labia. Cervix exhibits no motion tenderness, no discharge and no friability. Right adnexum displays no mass, no tenderness and no  fullness. Left adnexum displays no mass, no tenderness and no fullness. Vaginal discharge found.  Genitourinary Comments: Brown thin vaginal discharge in vaginal vault.  Musculoskeletal:       Right shoulder: Normal.       Left shoulder: Normal.       Cervical back: Normal.  Lymphadenopathy:    She has no cervical adenopathy.  Neurological: She is alert and oriented to person, place, and time. She has normal reflexes. No cranial nerve deficit. She exhibits normal muscle tone. Coordination normal.  Skin: Skin is warm and dry. No rash noted. She is not diaphoretic. No erythema. No pallor.  Psychiatric: She has a normal mood and affect. Her behavior is normal. Judgment and thought content normal.  Nursing note and vitals reviewed.  Results for orders placed or performed in visit on 01/16/16  Wet Prep for Trick, Yeast, Clue  Result Value Ref Range    Trichomonas Exam CANCELED   Vaginitis/Vaginosis, DNA Probe  Result Value Ref Range   Candida Species Negative Negative   Gardnerella vaginalis Positive (A) Negative   Trichomonas vaginosis Negative Negative  CBC with Differential/Platelet  Result Value Ref Range   WBC 5.9 3.4 - 10.8 x10E3/uL   RBC 4.25 3.77 - 5.28 x10E6/uL   Hemoglobin 12.9 11.1 - 15.9 g/dL   Hematocrit 39.3 34.0 - 46.6 %   MCV 93 79 - 97 fL   MCH 30.4 26.6 - 33.0 pg   MCHC 32.8 31.5 - 35.7 g/dL   RDW 13.4 12.3 - 15.4 %   Platelets 265 150 - 379 x10E3/uL   Neutrophils 61 Not Estab. %   Lymphs 25 Not Estab. %   Monocytes 10 Not Estab. %   Eos 3 Not Estab. %   Basos 1 Not Estab. %   Neutrophils Absolute 3.7 1.4 - 7.0 x10E3/uL   Lymphocytes Absolute 1.5 0.7 - 3.1 x10E3/uL   Monocytes Absolute 0.6 0.1 - 0.9 x10E3/uL   EOS (ABSOLUTE) 0.2 0.0 - 0.4 x10E3/uL   Basophils Absolute 0.0 0.0 - 0.2 x10E3/uL   Immature Granulocytes 0 Not Estab. %   Immature Grans (Abs) 0.0 0.0 - 0.1 x10E3/uL  Comprehensive metabolic panel  Result Value Ref Range   Glucose 101 (H) 65 - 99 mg/dL   BUN 16 8 - 27 mg/dL   Creatinine, Ser 0.96 0.57 - 1.00 mg/dL   GFR calc non Af Amer 59 (L) >59 mL/min/1.73   GFR calc Af Amer 68 >59 mL/min/1.73   BUN/Creatinine Ratio 17 12 - 28   Sodium 140 134 - 144 mmol/L   Potassium 4.2 3.5 - 5.2 mmol/L   Chloride 101 96 - 106 mmol/L   CO2 23 18 - 29 mmol/L   Calcium 9.5 8.7 - 10.3 mg/dL   Total Protein 7.0 6.0 - 8.5 g/dL   Albumin 4.4 3.5 - 4.8 g/dL   Globulin, Total 2.6 1.5 - 4.5 g/dL   Albumin/Globulin Ratio 1.7 1.2 - 2.2   Bilirubin Total 0.4 0.0 - 1.2 mg/dL   Alkaline Phosphatase 78 39 - 117 IU/L   AST 22 0 - 40 IU/L   ALT 20 0 - 32 IU/L  Lipid panel  Result Value Ref Range   Cholesterol, Total 269 (H) 100 - 199 mg/dL   Triglycerides 141 0 - 149 mg/dL   HDL 77 >39 mg/dL   VLDL Cholesterol Cal 28 5 - 40 mg/dL   LDL Calculated 164 (H) 0 - 99 mg/dL   Chol/HDL Ratio 3.5 0.0 - 4.4  ratio units    Hepatitis C antibody  Result Value Ref Range   Hep C Virus Ab <0.1 0.0 - 0.9 s/co ratio  Specimen status report  Result Value Ref Range   specimen status report Comment   Specimen status report  Result Value Ref Range   specimen status report Comment   POCT urinalysis dipstick  Result Value Ref Range   Color, UA yellow yellow   Clarity, UA clear clear   Glucose, UA negative negative   Bilirubin, UA small (A) negative   Ketones, POC UA trace (5) (A) negative   Spec Grav, UA 1.020    Blood, UA trace-lysed (A) negative   pH, UA 5.0    Protein Ur, POC negative negative   Urobilinogen, UA 0.2    Nitrite, UA Negative Negative   Leukocytes, UA large (3+) (A) Negative   Depression screen [facility] 2/9 01/16/2016 07/22/2013  Decreased Interest 0 0  Down, Depressed, Hopeless 0 0  PHQ - 2 Score 0 0   Fall Risk  01/16/2016 07/22/2013  Falls in the past year? No No   Functional Status Survey: Is the patient deaf or have difficulty hearing?: No Does the patient have difficulty seeing, even when wearing glasses/contacts?: Yes Does the patient have difficulty concentrating, remembering, or making decisions?: No Does the patient have difficulty walking or climbing stairs?: No Does the patient have difficulty dressing or bathing?: No Does the patient have difficulty doing errands alone such as visiting a doctor's office or shopping?: No     Assessment & Plan:   1. Encounter for Medicare annual wellness exam   2. Hypercholesterolemia   3. Need for hepatitis C screening test   4. Screening for diabetes mellitus   5. Vaginal discharge   6. Need for prophylactic vaccination against Streptococcus pneumoniae (pneumococcus)   7. Chronic nonseasonal allergic rhinitis due to pollen   8. Bacterial vaginosis     Orders Placed This Encounter  Procedures  . Wet Prep for [employer], Clue  . Vaginitis/Vaginosis, DNA Probe  . Pneumococcal conjugate vaccine 13-valent IM  . CBC with  Differential/Platelet  . Comprehensive metabolic panel    Order Specific Question:   Has the patient fasted?    Answer:   Yes  . Lipid panel    Order Specific Question:   Has the patient fasted?    Answer:   Yes  . Hepatitis C antibody  . Specimen status report  . Specimen status report  . POCT urinalysis dipstick   Meds ordered this encounter  Medications  . latanoprost (XALATAN) 0.005 % ophthalmic solution    Sig: 1 drop at bedtime.  . fluticasone (FLONASE) 50 MCG/ACT nasal spray    Sig: Place 2 sprays into both nostrils daily.    Dispense:  16 g    Refill:  6  . metroNIDAZOLE (METROGEL) 0.75 % vaginal gel    Sig: Place 1 Applicatorful vaginally 2 (two) times daily.    Dispense:  70 g    Refill:  0    Return in about 1 year (around 01/15/2017) for complete physical examiniation.   Amerika Nourse Elayne Guerin, M.D. [facility] [address] [place], [place]  [address] [phone] phone [phone] fax

## 2016-01-17 LAB — COMPREHENSIVE METABOLIC PANEL
A/G RATIO: 1.7 (ref 1.2–2.2)
ALT: 20 IU/L (ref 0–32)
AST: 22 IU/L (ref 0–40)
Albumin: 4.4 g/dL (ref 3.5–4.8)
Alkaline Phosphatase: 78 IU/L (ref 39–117)
BUN/Creatinine Ratio: 17 (ref 12–28)
BUN: 16 mg/dL (ref 8–27)
Bilirubin Total: 0.4 mg/dL (ref 0.0–1.2)
CALCIUM: 9.5 mg/dL (ref 8.7–10.3)
CO2: 23 mmol/L (ref 18–29)
CREATININE: 0.96 mg/dL (ref 0.57–1.00)
Chloride: 101 mmol/L (ref 96–106)
GFR, EST AFRICAN AMERICAN: 68 mL/min/{1.73_m2} (ref >59)
GFR, EST NON AFRICAN AMERICAN: 59 mL/min/{1.73_m2} — AB (ref >59)
GLOBULIN, TOTAL: 2.6 g/dL (ref 1.5–4.5)
Glucose: 101 mg/dL — ABNORMAL HIGH (ref 65–99)
Potassium: 4.2 mmol/L (ref 3.5–5.2)
SODIUM: 140 mmol/L (ref 134–144)
TOTAL PROTEIN: 7 g/dL (ref 6.0–8.5)

## 2016-01-17 LAB — LIPID PANEL
CHOL/HDL RATIO: 3.5 ratio (ref 0.0–4.4)
Cholesterol, Total: 269 mg/dL — ABNORMAL HIGH (ref 100–199)
HDL: 77 mg/dL (ref >39)
LDL CALC: 164 mg/dL — AB (ref 0–99)
Triglycerides: 141 mg/dL (ref 0–149)
VLDL Cholesterol Cal: 28 mg/dL (ref 5–40)

## 2016-01-17 LAB — CBC WITH DIFFERENTIAL/PLATELET
BASOS: 1 %
Basophils Absolute: 0 10*3/uL (ref 0.0–0.2)
EOS (ABSOLUTE): 0.2 10*3/uL (ref 0.0–0.4)
EOS: 3 %
HEMATOCRIT: 39.3 % (ref 34.0–46.6)
HEMOGLOBIN: 12.9 g/dL (ref 11.1–15.9)
IMMATURE GRANS (ABS): 0 10*3/uL (ref 0.0–0.1)
IMMATURE GRANULOCYTES: 0 %
LYMPHS: 25 %
Lymphocytes Absolute: 1.5 10*3/uL (ref 0.7–3.1)
MCH: 30.4 pg (ref 26.6–33.0)
MCHC: 32.8 g/dL (ref 31.5–35.7)
MCV: 93 fL (ref 79–97)
MONOCYTES: 10 %
Monocytes Absolute: 0.6 10*3/uL (ref 0.1–0.9)
NEUTROS PCT: 61 %
Neutrophils Absolute: 3.7 10*3/uL (ref 1.4–7.0)
Platelets: 265 10*3/uL (ref 150–379)
RBC: 4.25 x10E6/uL (ref 3.77–5.28)
RDW: 13.4 % (ref 12.3–15.4)
WBC: 5.9 10*3/uL (ref 3.4–10.8)

## 2016-01-17 LAB — HEPATITIS C ANTIBODY

## 2016-01-18 LAB — WET PREP FOR TRICH, YEAST, CLUE

## 2016-01-18 LAB — SPECIMEN STATUS REPORT

## 2016-02-08 MED ORDER — METRONIDAZOLE 0.75 % VA GEL: 1.0000 | Freq: Two times a day (BID) | VAGINAL | 0 refills | Status: DC

## 2016-02-09 LAB — VAGINITIS/VAGINOSIS, DNA PROBE
CANDIDA SPECIES: NEGATIVE
GARDNERELLA VAGINALIS: POSITIVE — AB
TRICHOMONAS VAG: NEGATIVE

## 2016-02-09 LAB — SPECIMEN STATUS REPORT

## 2016-05-01 DIAGNOSIS — H401131 Primary open-angle glaucoma, bilateral, mild stage: Secondary | ICD-10-CM | POA: Diagnosis not present

## 2016-05-24 ENCOUNTER — Encounter: Payer: Self-pay | Admitting: Family Medicine

## 2016-05-24 ENCOUNTER — Ambulatory Visit (INDEPENDENT_AMBULATORY_CARE_PROVIDER_SITE_OTHER): Payer: Commercial Managed Care - HMO | Admitting: Family Medicine

## 2016-05-24 VITALS — BP 127/75 | HR 91 | Temp 97.8°F | Resp 18 | Ht 62.8 in | Wt 148.5 lb

## 2016-05-24 DIAGNOSIS — R3129 Other microscopic hematuria: Secondary | ICD-10-CM | POA: Diagnosis not present

## 2016-05-24 DIAGNOSIS — R300 Dysuria: Secondary | ICD-10-CM | POA: Diagnosis not present

## 2016-05-24 DIAGNOSIS — N898 Other specified noninflammatory disorders of vagina: Secondary | ICD-10-CM | POA: Diagnosis not present

## 2016-05-24 LAB — POCT WET + KOH PREP
Trich by wet prep: ABSENT
Yeast by KOH: ABSENT
Yeast by wet prep: ABSENT

## 2016-05-24 LAB — POC MICROSCOPIC URINALYSIS (UMFC): Mucus: ABSENT

## 2016-05-24 LAB — POCT URINALYSIS DIP (MANUAL ENTRY)
BILIRUBIN UA: NEGATIVE mg/dL
Bilirubin, UA: NEGATIVE
Glucose, UA: NEGATIVE mg/dL
Nitrite, UA: NEGATIVE
Protein Ur, POC: NEGATIVE mg/dL
SPEC GRAV UA: 1.015 (ref 1.010–1.025)
Urobilinogen, UA: 0.2 E.U./dL
pH, UA: 5 (ref 5.0–8.0)

## 2016-05-24 NOTE — Patient Instructions (Addendum)
IF you received an x-ray today, you will receive an invoice from [facility]. Please contact [facility] at [phone] with questions or concerns regarding your invoice.   IF you received labwork today, you will receive an invoice from [facility]. Please contact [facility] at [phone] with questions or concerns regarding your invoice.   Our billing staff will not be able to assist you with questions regarding bills from these companies.  You will be contacted with the lab results as soon as they are available. The fastest way to get your results is to activate your My Chart account. Instructions are located on the last page of this paperwork. If you have not heard from [place] regarding the results in 2 weeks, please contact this office.     Hematuria, Adult Hematuria is blood in your urine. It can be caused by a bladder infection, kidney infection, prostate infection, kidney stone, or cancer of your urinary tract. Infections can usually be treated with medicine, and a kidney stone usually will pass through your urine. If neither of these is the cause of your hematuria, further workup to find out the reason may be needed. It is very important that you tell your health care provider about any blood you see in your urine, even if the blood stops without treatment or happens without causing pain. Blood in your urine that happens and then stops and then happens again can be a symptom of a very serious condition. Also, pain is not a symptom in the initial stages of many urinary cancers. Follow these instructions at home:  Drink lots of fluid, 3-4 quarts a day. If you have been diagnosed with an infection, cranberry juice is especially recommended, in addition to large amounts of water.  Avoid caffeine, tea, and carbonated beverages because they tend to irritate the bladder.  Avoid alcohol because it may irritate the prostate.  Take all medicines as directed by your health care  provider.  If you were prescribed an antibiotic medicine, finish it all even if you start to feel better.  If you have been diagnosed with a kidney stone, follow your health care provider's instructions regarding straining your urine to catch the stone.  Empty your bladder often. Avoid holding urine for long periods of time.  After a bowel movement, women should cleanse front to back. Use each tissue only once.  Empty your bladder before and after sexual intercourse if you are a female. Contact a health care provider if:  You develop back pain.  You have a fever.  You have a feeling of sickness in your stomach (nausea) or vomiting.  Your symptoms are not better in 3 days. Return sooner if you are getting worse. Get help right away if:  You develop severe vomiting and are unable to keep the medicine down.  You develop severe back or abdominal pain despite taking your medicines.  You begin passing a large amount of blood or clots in your urine.  You feel extremely weak or faint, or you pass out. This information is not intended to replace advice given to you by your health care provider. Make sure you discuss any questions you have with your health care provider. Document Released: 12/30/2004 Document Revised: 06/07/2015 Document Reviewed: 08/30/2012 Elsevier Interactive Patient Education  2017 Elsevier Inc.   Bacterial Vaginosis Bacterial vaginosis is a vaginal infection that occurs when the normal balance of bacteria in the vagina is disrupted. It results from an overgrowth of certain bacteria. This is  the most common vaginal infection among women ages 60-44. Because bacterial vaginosis increases your risk for STIs (sexually transmitted infections), getting treated can help reduce your risk for chlamydia, gonorrhea, herpes, and HIV (human immunodeficiency virus). Treatment is also important for preventing complications in pregnant women, because this condition can cause an early  (premature) delivery. What are the causes? This condition is caused by an increase in harmful bacteria that are normally present in small amounts in the vagina. However, the reason that the condition develops is not fully understood. What increases the risk? The following factors may make you more likely to develop this condition:  Having a new sexual partner or multiple sexual partners.  Having unprotected sex.  Douching.  Having an intrauterine device (IUD).  Smoking.  Drug and alcohol abuse.  Taking certain antibiotic medicines.  Being pregnant. You cannot get bacterial vaginosis from toilet seats, bedding, swimming pools, or contact with objects around you. What are the signs or symptoms? Symptoms of this condition include:  Grey or white vaginal discharge. The discharge can also be watery or foamy.  A fish-like odor with discharge, especially after sexual intercourse or during menstruation.  Itching in and around the vagina.  Burning or pain with urination. Some women with bacterial vaginosis have no signs or symptoms. How is this diagnosed? This condition is diagnosed based on:  Your medical history.  A physical exam of the vagina.  Testing a sample of vaginal fluid under a microscope to look for a large amount of bad bacteria or abnormal cells. Your health care provider may use a cotton swab or a small wooden spatula to collect the sample. How is this treated? This condition is treated with antibiotics. These may be given as a pill, a vaginal cream, or a medicine that is put into the vagina (suppository). If the condition comes back after treatment, a second round of antibiotics may be needed. Follow these instructions at home: Medicines   Take over-the-counter and prescription medicines only as told by your health care provider.  Take or use your antibiotic as told by your health care provider. Do not stop taking or using the antibiotic even if you start to  feel better. General instructions   If you have a female sexual partner, tell her that you have a vaginal infection. She should see her health care provider and be treated if she has symptoms. If you have a female sexual partner, he does not need treatment.  During treatment:  Avoid sexual activity until you finish treatment.  Do not douche.  Avoid alcohol as directed by your health care provider.  Avoid breastfeeding as directed by your health care provider.  Drink enough water and fluids to keep your urine clear or pale yellow.  Keep the area around your vagina and rectum clean.  Wash the area daily with warm water.  Wipe yourself from front to back after using the toilet.  Keep all follow-up visits as told by your health care provider. This is important. How is this prevented?  Do not douche.  Wash the outside of your vagina with warm water only.  Use protection when having sex. This includes latex condoms and dental dams.  Limit how many sexual partners you have. To help prevent bacterial vaginosis, it is best to have sex with just one partner (monogamous).  Make sure you and your sexual partner are tested for STIs.  Wear cotton or cotton-lined underwear.  Avoid wearing tight pants and pantyhose, especially during summer.  Limit the amount of alcohol that you drink.  Do not use any products that contain nicotine or tobacco, such as cigarettes and e-cigarettes. If you need help quitting, ask your health care provider.  Do not use illegal drugs. Where to find more information:  Centers for Disease Control and Prevention: AppraiserFraud.fi  American Sexual Health Association (ASHA): www.ashastd.org  U.S. Department of Health and Financial controller, Office on Women's Health: DustingSprays.pl or SecuritiesCard.it Contact a health care provider if:  Your symptoms do not improve, even after treatment.  You have more discharge  or pain when urinating.  You have a fever.  You have pain in your abdomen.  You have pain during sex.  You have vaginal bleeding between periods. Summary  Bacterial vaginosis is a vaginal infection that occurs when the normal balance of bacteria in the vagina is disrupted.  Because bacterial vaginosis increases your risk for STIs (sexually transmitted infections), getting treated can help reduce your risk for chlamydia, gonorrhea, herpes, and HIV (human immunodeficiency virus). Treatment is also important for preventing complications in pregnant women, because the condition can cause an early (premature) delivery.  This condition is treated with antibiotic medicines. These may be given as a pill, a vaginal cream, or a medicine that is put into the vagina (suppository). This information is not intended to replace advice given to you by your health care provider. Make sure you discuss any questions you have with your health care provider. Document Released: 12/30/2004 Document Revised: 09/15/2015 Document Reviewed: 09/15/2015 Elsevier Interactive Patient Education  2017 [employer].

## 2016-05-24 NOTE — Progress Notes (Signed)
Subjective:  By signing my name below, I, Essence Howell, attest that this documentation has been prepared under the direction and in the presence of Delman Cheadle, MD Electronically Signed: Ladene Artist, ED Scribe 05/24/2016 at 1:53 PM.   Patient ID: Janet Kramer, female    DOB: 11/15/1945, 71 y.o.   MRN: [id number]  Chief Complaint  Patient presents with  . Urinary Tract Infection    C/O cloudy urine, urgency, frequency x 2-3 days   HPI Janet Kramer is a 71 y.o. female who presents to Primary Care at [facility] complaining of urinary frequency for the past 2-3 days. Pt reports associated symptoms of incomplete emptying, urinary urgency, vaginal discharge and cloudy urine. She was prescribed a Metrogel which she states temporarily provided relief of non-odorous, sticky vaginal discharge. She suspects that she was having a reaction to the medication, stating that at one time she was unable to apply the Metrogel vaginally. Pt drinks seltzer water and coffee daily. Pt denies recent swimming, baths, douching, change in sexual partners, lubricants, exercise or diet; pt is an omnivore. She denies a recurrent h/o bacterial vaginosis; states that this is a new problem for her.  Pt states that she did have left flank pain a few weeks ago that made her wonder if she had a kidney stone but no h/o of such.   Past Medical History:  Diagnosis Date  . Allergy   . Arthritis   . Asthma   . Bronchitis    has recurrent bronchitis  . Cataract   . Glaucoma   . Hyperlipidemia    Current Outpatient Prescriptions on File Prior to Visit  Medication Sig Dispense Refill  . aspirin 325 MG tablet Take 325 mg by mouth daily.    . fluticasone (FLONASE) 50 MCG/ACT nasal spray Place 2 sprays into both nostrils daily. 16 g 6  . latanoprost (XALATAN) 0.005 % ophthalmic solution 1 drop at bedtime.    . metroNIDAZOLE (METROGEL) 0.75 % vaginal gel Place 1 Applicatorful vaginally 2 (two) times daily. 70 g 0   Current  Facility-Administered Medications on File Prior to Visit  Medication Dose Route Frequency Provider Last Rate Last Dose  . triamcinolone acetonide (KENALOG-40) injection 40 mg  40 mg Intramuscular Once Roselee Culver, MD       Allergies  Allergen Reactions  . Codeine     Overly sleepy  . Oxycontin [Oxycodone Hcl] Nausea And Vomiting   Past Surgical History:  Procedure Laterality Date  . [place]   right  . FRACTURE SURGERY  2001   ankle - right     . ROTATOR CUFF REPAIR    . TONSILLECTOMY  1949   Family History  Problem Relation Age of Onset  . Cancer Mother        colon  . Colon cancer Mother 92  . Cancer Father        lung  . Diabetes Paternal Grandmother   . Stroke Maternal Grandmother   . Heart disease Maternal Grandfather   . Stomach cancer Neg Hx    Social History   Social History  . Marital status: Married    Spouse name: N/A  . Number of children: 2  . Years of education: N/A   Occupational History  . [employer]   Social History Main Topics  . Smoking status: Never Smoker  . Smokeless tobacco: Never Used  . Alcohol use Yes     Comment: occasional  .  Drug use: No  . Sexual activity: Not Asked   Other Topics Concern  . 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      ADLs: independent with ADLs.      Advanced Directives: NO; FULL CODE; no prolonged measures; HCPOA: daughter not husband; [name].  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 Very proud of their excellent ACT scores. Has a granddaughter who is in graduate  school for statistics so patient has a lot to say on the perception of evidence based medicine and disease frequency. She does not respond well to discussions of our RCTs Depression screen [facility] 2/9 05/24/2016 01/16/2016 07/22/2013  Decreased Interest 0 0 0  Down, Depressed, Hopeless 0 0 0  PHQ - 2 Score 0 0 0    Review of Systems  Constitutional: Negative for activity change, appetite change, chills and fever.  Gastrointestinal: Negative for abdominal pain, diarrhea, nausea and vomiting.  Genitourinary: Positive for flank pain (left; resolved), frequency, urgency and vaginal discharge.  Musculoskeletal: Negative for back pain.  Skin: Negative for pallor.  Neurological: Negative for dizziness and light-headedness.  Psychiatric/Behavioral: Negative for dysphoric mood. The patient is not nervous/anxious.       Objective:   Physical Exam  Constitutional: She is oriented to person, place, and time. She appears well-developed and well-nourished. No distress.  HENT:  Head: Normocephalic and atraumatic.  Eyes: Conjunctivae and EOM are normal.  Neck: Neck supple. No tracheal deviation present.  Cardiovascular: Normal rate.   Pulmonary/Chest: Effort normal. No respiratory distress.  Genitourinary:  Genitourinary Comments: Normal labia. Normal vagina. No odor. No discharge. speculum and bimanual exam not done.  Musculoskeletal: Normal range of motion.  Neurological: She is alert and oriented to person, place, and time.  Skin: Skin is warm and dry.  Psychiatric: She has a normal mood and affect. Her behavior is normal.  Nursing note and vitals reviewed.   BP 127/75   Pulse 91   Temp 97.8 F (36.6 C) (Oral)   Resp 18   Ht 5' 2.8" (1.595 m)   Wt 148 lb 8 oz (67.4 kg)   SpO2 98%   BMI 26.47 kg/m      Results for orders placed or performed in visit on 05/24/16  POCT urinalysis dipstick  Result Value Ref Range   Color, UA yellow yellow   Clarity, UA cloudy (A) clear   Glucose, UA negative  negative mg/dL   Bilirubin, UA negative negative   Ketones, POC UA negative negative mg/dL   Spec Grav, UA 1.015 1.010 - 1.025   Blood, UA moderate (A) negative   pH, UA 5.0 5.0 - 8.0   Protein Ur, POC negative negative mg/dL   Urobilinogen, UA 0.2 0.2 or 1.0 E.U./dL   Nitrite, UA Negative Negative   Leukocytes, UA Small (1+) (A) Negative  POCT Microscopic Urinalysis ([facility])  Result Value Ref Range   WBC,UR,HPF,POC Too numerous to count  (A) None WBC/hpf  RBC,UR,HPF,POC Moderate (A) None RBC/hpf   Bacteria None None, Too numerous to count   Mucus Absent Absent   Epithelial Cells, UR Per Microscopy Moderate (A) None, Too numerous to count cells/hpf  POCT Wet + KOH Prep  Result Value Ref Range   Yeast by KOH Absent Absent   Yeast by wet prep Absent Absent   WBC by wet prep Moderate (A) Few   Clue Cells Wet Prep HPF POC None None   Trich by wet prep Absent Absent   Bacteria Wet Prep HPF POC Few Few   Epithelial Cells By [employer] Pref ([facility]) Moderate (A) None, Few, Too numerous to count   RBC,UR,HPF,POC None None RBC/hpf   Assessment & Plan:   1. Dysuria - ua nml, pt agrees to wait for UClx results before determining need for antibiotic therapy. Prior UTI has presented with mainly hematuria 2 days you a similar but more mild. Patient elects to try to drink cranberry juice in the interim - reviewed variable evidence basis of this.  2. Vaginal discharge - has developed episodic days of large gushes of vaginal discharge which is new development over the past several months - did not have throughout the prior 68 years of her life. Was diagnosed with BV at last visit but possible that she did not get full treatment of MetroGel due to what sounds like irritative swelling of vaginal tissues. However patient states that today she is symptom-free and her wet prep is normal. Recommended patient make a same day appointment on one of the days that she is actively having discharge. If this is unsuccessful  or inconvenient because even arranged for her to collect a home wet prep at a time when symptoms are most severe with a future order entered.   3. Other microscopic hematuria - Can't exclude the possibility of kidney stone as did have some sharp left flank pain several days prior but no history of so will hold off on further workup unless pain symptoms or urinary symptoms continue. Patient agreeable to plan. Addendum: due to UTI. Recheck at follow-up to ensure has resolved.     Orders Placed This Encounter  Procedures  . Urine culture  . POCT urinalysis dipstick  . POCT Microscopic Urinalysis ([facility])  . POCT Wet + KOH Prep     I personally performed the services described in this documentation, which was scribed in my presence. The recorded information has been reviewed and considered, and addended by me as needed.   ADDENDUM: Urine culture resulted showing 25-50 K of E.coli CFU's. Will treat with Keflex 500 twice a day 1 week  Delman Cheadle, M.D.  Primary Care at [facility] [address] [place], [place] [address] [phone] phone [phone] fax  05/26/16 11:41 AM

## 2016-05-26 LAB — URINE CULTURE

## 2016-05-26 MED ORDER — CEPHALEXIN 500 MG PO CAPS: 500.0000 mg | ORAL_CAPSULE | Freq: Two times a day (BID) | ORAL | 0 refills | Status: DC

## 2016-11-20 DIAGNOSIS — H401131 Primary open-angle glaucoma, bilateral, mild stage: Secondary | ICD-10-CM | POA: Diagnosis not present

## 2017-01-07 IMAGING — CR DG SHOULDER 2+V*R*
3 series · 3 of 3 positions shown · non-contrast
Comparison: None.

CLINICAL DATA: Ongoing right shoulder pain, worsening shoulder
pain.

EXAM:
RIGHT SHOULDER - 2+ VIEW

[AP]
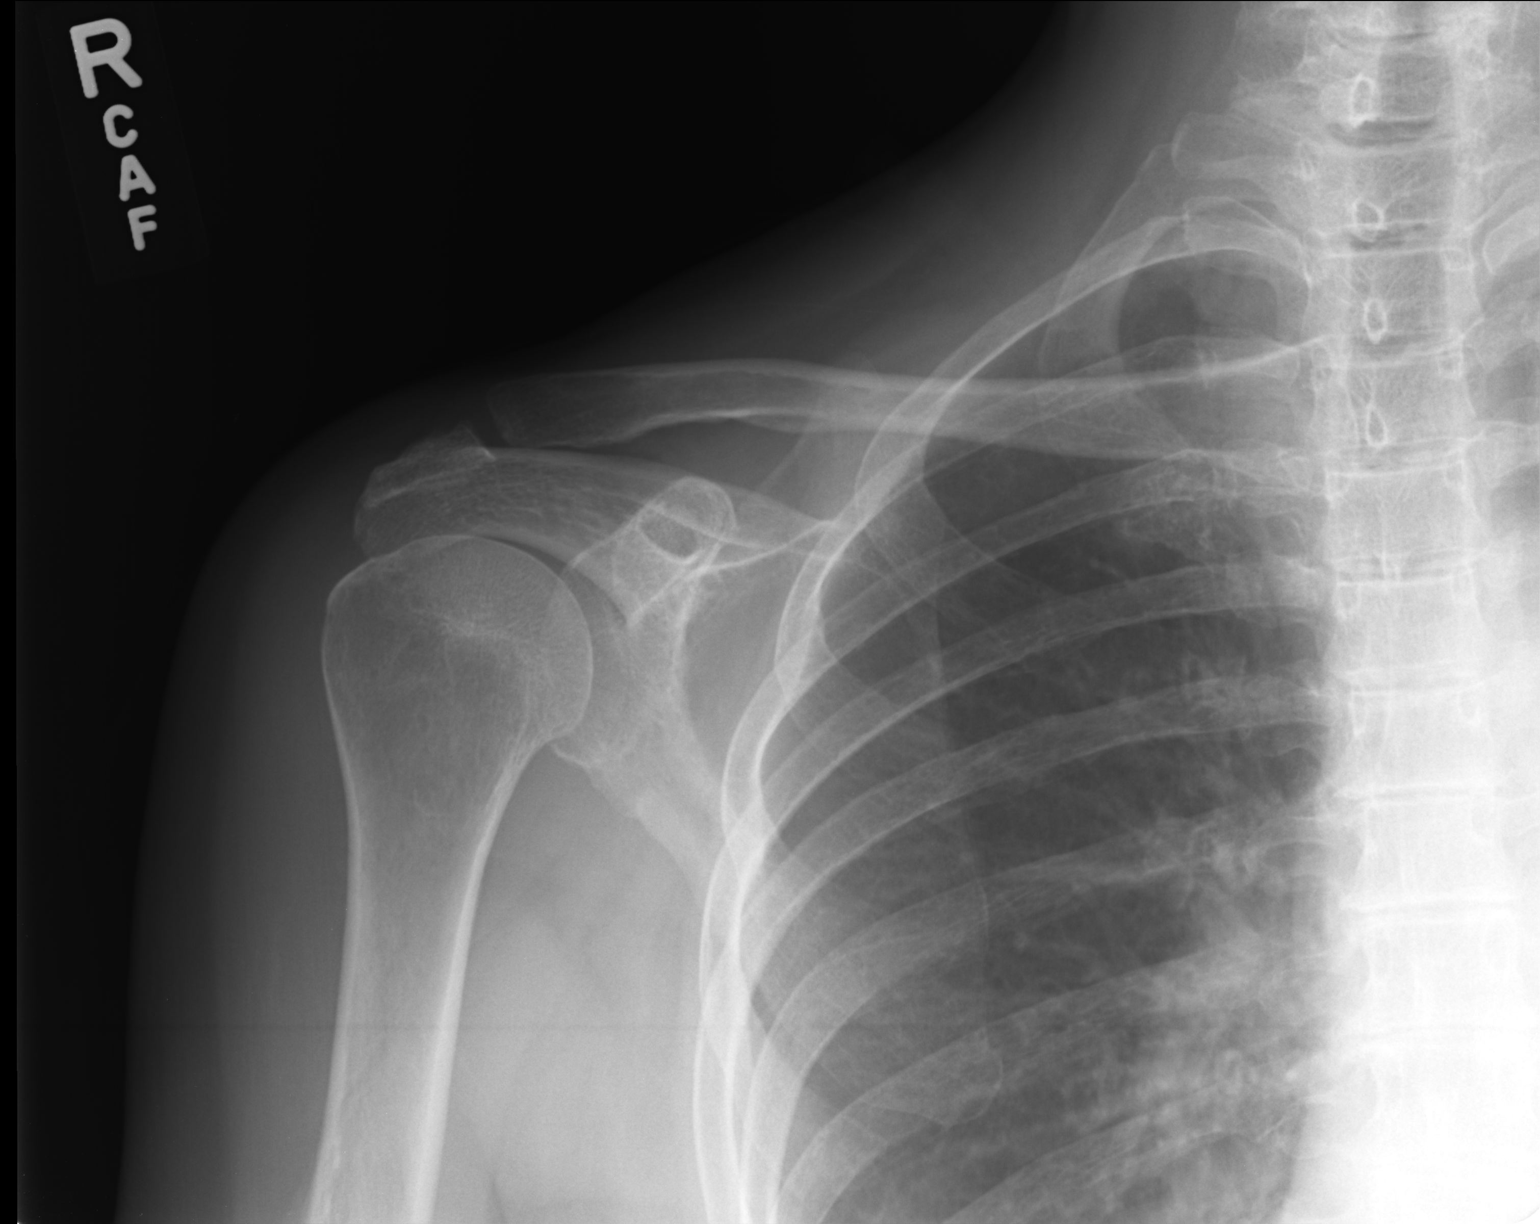

[ap ext rot]
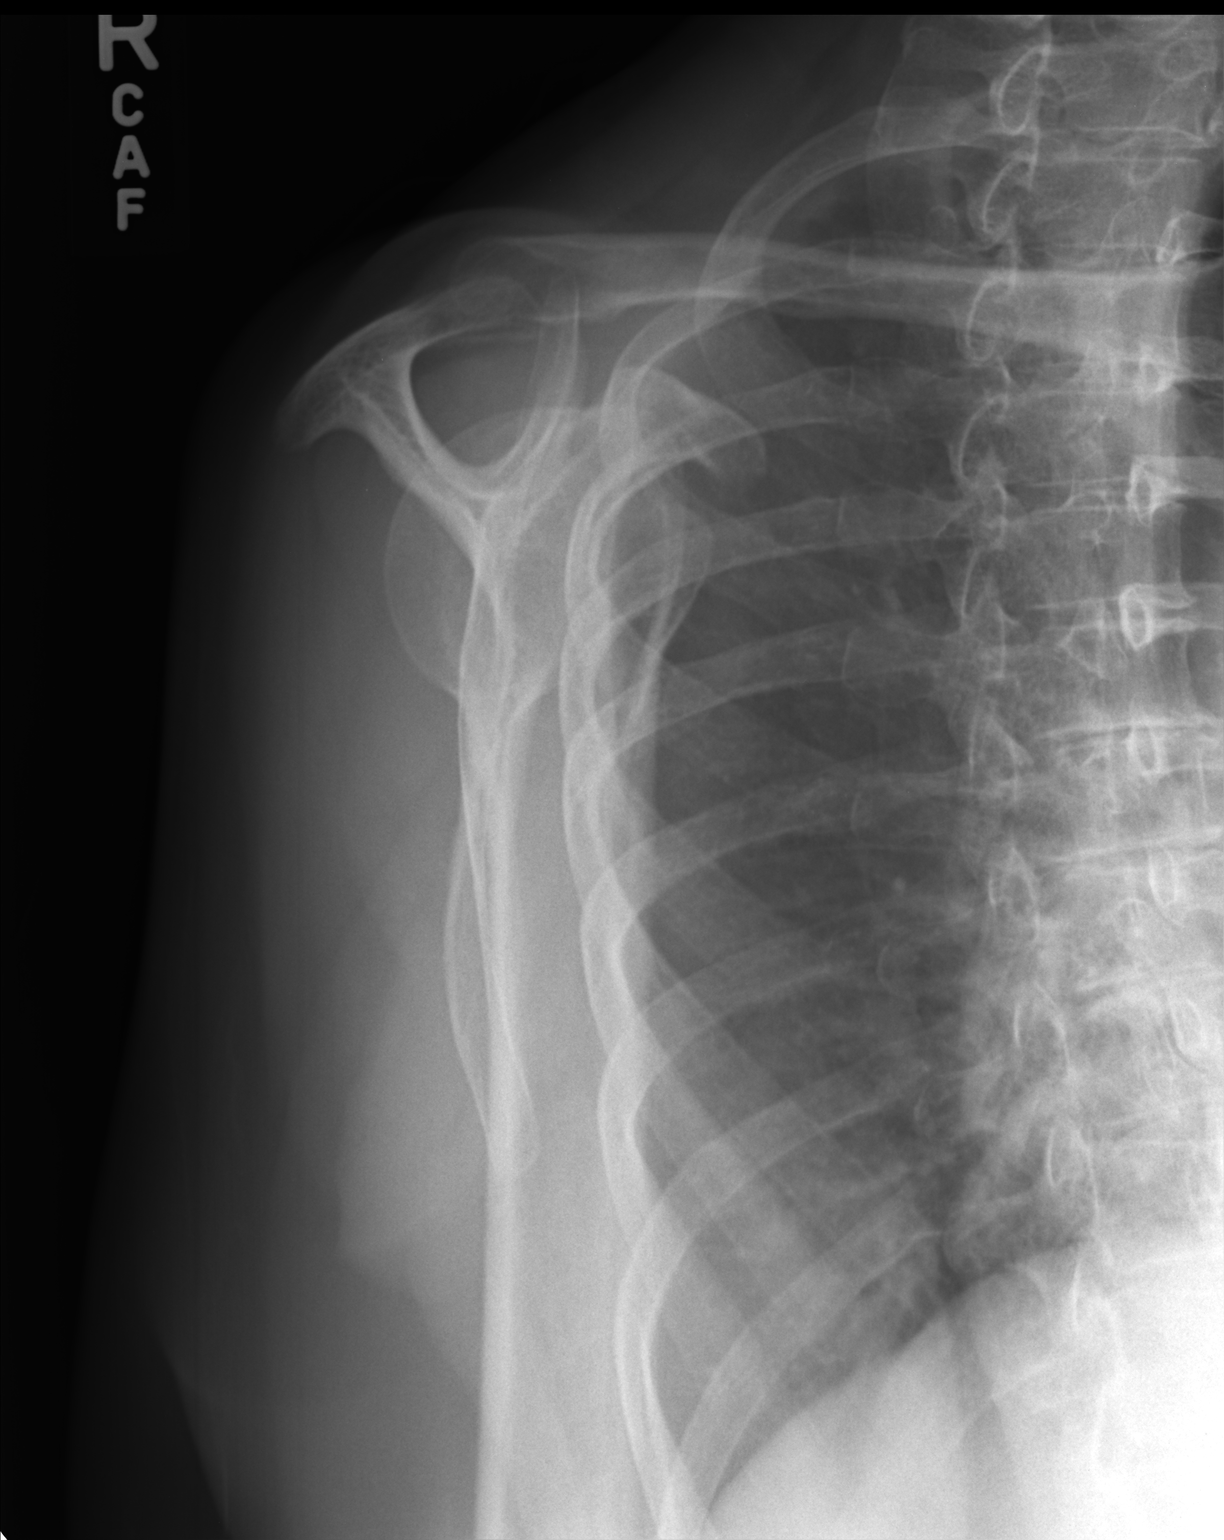

[axial]
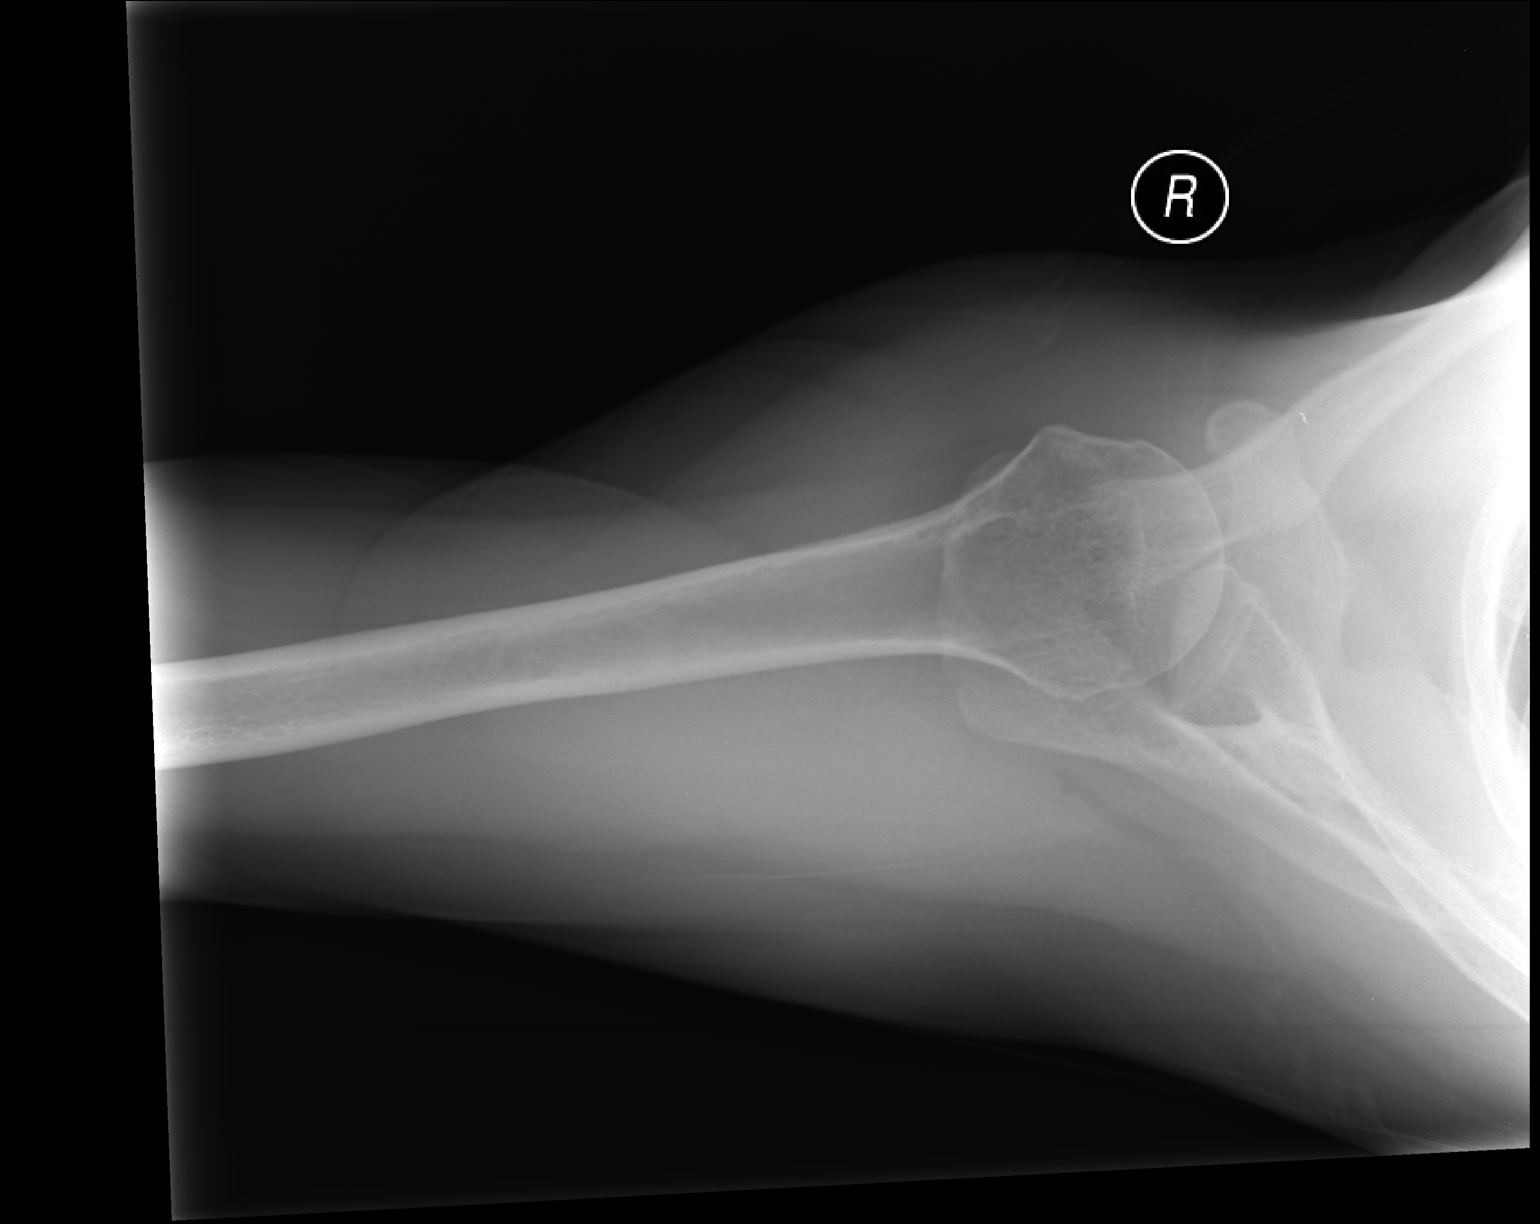

[3 of 3 positions shown; findings below may reference images not displayed]

FINDINGS: No acute osseous or joint abnormality. Mild degenerative changes in
the right acromioclavicular joint. Visualized portion of the right
chest is unremarkable.
IMPRESSION: Mild right acromioclavicular joint osteoarthritis.

## 2017-02-17 ENCOUNTER — Encounter: Payer: Self-pay | Admitting: Gastroenterology

## 2017-06-10 ENCOUNTER — Encounter: Payer: Self-pay | Admitting: Family Medicine

## 2017-06-25 DIAGNOSIS — H401131 Primary open-angle glaucoma, bilateral, mild stage: Secondary | ICD-10-CM | POA: Diagnosis not present

## 2017-06-25 DIAGNOSIS — H524 Presbyopia: Secondary | ICD-10-CM | POA: Diagnosis not present

## 2017-09-07 DIAGNOSIS — H353131 Nonexudative age-related macular degeneration, bilateral, early dry stage: Secondary | ICD-10-CM | POA: Diagnosis not present

## 2017-09-07 DIAGNOSIS — H20011 Primary iridocyclitis, right eye: Secondary | ICD-10-CM | POA: Diagnosis not present

## 2017-09-08 DIAGNOSIS — H353131 Nonexudative age-related macular degeneration, bilateral, early dry stage: Secondary | ICD-10-CM | POA: Diagnosis not present

## 2017-09-08 DIAGNOSIS — H20011 Primary iridocyclitis, right eye: Secondary | ICD-10-CM | POA: Diagnosis not present

## 2017-09-08 DIAGNOSIS — H401131 Primary open-angle glaucoma, bilateral, mild stage: Secondary | ICD-10-CM | POA: Diagnosis not present

## 2017-09-11 DIAGNOSIS — H20011 Primary iridocyclitis, right eye: Secondary | ICD-10-CM | POA: Diagnosis not present

## 2017-09-11 DIAGNOSIS — H401131 Primary open-angle glaucoma, bilateral, mild stage: Secondary | ICD-10-CM | POA: Diagnosis not present

## 2017-09-11 DIAGNOSIS — H353131 Nonexudative age-related macular degeneration, bilateral, early dry stage: Secondary | ICD-10-CM | POA: Diagnosis not present

## 2018-01-12 ENCOUNTER — Ambulatory Visit: Payer: Self-pay

## 2018-01-12 NOTE — Telephone Encounter (Signed)
Pt. Reports she started having left shoulder pain "a few weeks ago." Hurts back in the shoulder blade are and moves around - sometimes hurts under her arm. States she has had rotator cuff issues, and this is different. Hurts into the left under arm at times. Reports she has been taking ASA, calcium, and applying heat. Ice makes it worse. No availability this week in practice. Instructed to call Thursday after the holiday to see if a slot has opened up. Will try topical OTC  And Tylenol. Instructed to go to ED if symptoms worsen. Verbalizes understanding Reason for Disposition . [1] MODERATE pain (e.g., interferes with normal activities) AND [2] present > 3 days  Answer Assessment - Initial Assessment Questions 1. ONSET: "When did the pain start?"     Started a few weeks ago 2. LOCATION: "Where is the pain located?"     Shoulder blade in back and moves around and under your arm. 3. PAIN: "How bad is the pain?" (Scale 1-10; or mild, moderate, severe)   - MILD (1-3): doesn't interfere with normal activities   - MODERATE (4-7): interferes with normal activities (e.g., work or school) or awakens from sleep   - SEVERE (8-10): excruciating pain, unable to do any normal activities, unable to move arm at all due to pain      0-7 4. WORK OR EXERCISE: "Has there been any recent work or exercise that involved this part of the body?"     No 5. CAUSE: "What do you think is causing the shoulder pain?"      Unsure 6. OTHER SYMPTOMS: "Do you have any other symptoms?" (e.g., neck pain, swelling, rash, fever, numbness, weakness)     No 7. PREGNANCY: "Is there any chance you are pregnant?" "When was your last menstrual period?"     n/a  Protocols used: SHOULDER PAIN-A-AH

## 2018-01-14 ENCOUNTER — Ambulatory Visit (INDEPENDENT_AMBULATORY_CARE_PROVIDER_SITE_OTHER): Payer: Medicare HMO

## 2018-01-14 ENCOUNTER — Telehealth: Payer: Self-pay | Admitting: General Practice

## 2018-01-14 ENCOUNTER — Ambulatory Visit (INDEPENDENT_AMBULATORY_CARE_PROVIDER_SITE_OTHER): Payer: Medicare HMO | Admitting: Family Medicine

## 2018-01-14 ENCOUNTER — Other Ambulatory Visit: Payer: Self-pay

## 2018-01-14 ENCOUNTER — Encounter: Payer: Self-pay | Admitting: Family Medicine

## 2018-01-14 VITALS — BP 134/80 | HR 86 | Temp 97.6°F | Ht 63.0 in | Wt 150.4 lb

## 2018-01-14 DIAGNOSIS — M25512 Pain in left shoulder: Secondary | ICD-10-CM

## 2018-01-14 DIAGNOSIS — M792 Neuralgia and neuritis, unspecified: Secondary | ICD-10-CM | POA: Diagnosis not present

## 2018-01-14 DIAGNOSIS — M542 Cervicalgia: Secondary | ICD-10-CM | POA: Diagnosis not present

## 2018-01-14 DIAGNOSIS — M79602 Pain in left arm: Secondary | ICD-10-CM | POA: Diagnosis not present

## 2018-01-14 DIAGNOSIS — R079 Chest pain, unspecified: Secondary | ICD-10-CM

## 2018-01-14 DIAGNOSIS — R0789 Other chest pain: Secondary | ICD-10-CM

## 2018-01-14 DIAGNOSIS — R42 Dizziness and giddiness: Secondary | ICD-10-CM

## 2018-01-14 DIAGNOSIS — M47812 Spondylosis without myelopathy or radiculopathy, cervical region: Secondary | ICD-10-CM | POA: Diagnosis not present

## 2018-01-14 LAB — POCT CBC
Granulocyte percent: 73.2 %G (ref 37–80)
HCT, POC: 42.1 % — AB (ref 29–41)
Hemoglobin: 14.3 g/dL (ref 11–14.6)
Lymph, poc: 0.9 (ref 0.6–3.4)
MCH, POC: 31.2 pg (ref 27–31.2)
MCHC: 34 g/dL (ref 31.8–35.4)
MCV: 91.8 fL (ref 76–111)
MID (cbc): 0.7 (ref 0–0.9)
MPV: 6.9 fL (ref 0–99.8)
POC Granulocyte: 4.4 (ref 2–6.9)
POC LYMPH PERCENT: 14.9 %L (ref 10–50)
POC MID %: 11.9 %M (ref 0–12)
Platelet Count, POC: 254 10*3/uL (ref 142–424)
RBC: 4.58 M/uL (ref 4.04–5.48)
RDW, POC: 13.1 %
WBC: 6 10*3/uL (ref 4.6–10.2)

## 2018-01-14 LAB — GLUCOSE, POCT (MANUAL RESULT ENTRY): POC Glucose: 92 mg/dl (ref 70–99)

## 2018-01-14 MED ORDER — PREDNISONE 20 MG PO TABS: 40.0000 mg | ORAL_TABLET | Freq: Every day | ORAL | 0 refills | Status: DC

## 2018-01-14 NOTE — Patient Instructions (Addendum)
X-rays were overall reassuring.  Neck x-ray did show some arthritis, and based on your exam and history you could have a pinched nerve that is traveling to the upper back and arm.  Can start with prednisone for now, Tylenol over-the-counter as needed and recheck in 2 days.  There were some possible abnormalities on EKG but I do not see anything acute.  I will refer you to cardiology but if any return of chest pain, shortness of breath or pain with breathing, or other worsening symptoms proceed directly to the emergency room or call 911. Return to the clinic or go to the nearest emergency room if any of your symptoms worsen or new symptoms occur.  Cervical Radiculopathy  Cervical radiculopathy happens when a nerve in the neck (cervical nerve) is pinched or bruised. This condition can develop because of an injury or as part of the normal aging process. Pressure on the cervical nerves can cause pain or numbness that runs from the neck all the way down into the arm and fingers. Usually, this condition gets better with rest. Treatment may be needed if the condition does not improve. What are the causes? This condition may be caused by:  Injury.  Slipped (herniated) disk.  Muscle tightness in the neck because of overuse.  Arthritis.  Breakdown or degeneration in the bones and joints of the spine (spondylosis) due to aging.  Bone spurs that may develop near the cervical nerves. What are the signs or symptoms? Symptoms of this condition include:  Pain that runs from the neck to the arm and hand. The pain can be severe or irritating. It may be worse when the neck is moved.  Numbness or weakness in the affected arm and hand. How is this diagnosed? This condition may be diagnosed based on symptoms, medical history, and a physical exam. You may also have tests, including:  X-rays.  CT scan.  MRI.  Electromyogram (EMG).  Nerve conduction tests. How is this treated? In many cases,  treatment is not needed for this condition. With rest, the condition usually gets better over time. If treatment is needed, options may include:  Wearing a soft neck collar for short periods of time.  Physical therapy to strengthen your neck muscles.  Medicines, such as NSAIDs, oral corticosteroids, or spinal injections.  Surgery. This may be needed if other treatments do not help. Various types of surgery may be done depending on the cause of your problems. Follow these instructions at home: Managing pain  Take over-the-counter and prescription medicines only as told by your health care provider.  If directed, apply ice to the affected area. ? Put ice in a plastic bag. ? Place a towel between your skin and the bag. ? Leave the ice on for 20 minutes, 2-3 times per day.  If ice does not help, you can try using heat. Take a warm shower or warm bath, or use a heat pack as told by your health care provider.  Try a gentle neck and shoulder massage to help relieve symptoms. Activity  Rest as needed. Follow instructions from your health care provider about any restrictions on activities.  Do stretching and strengthening exercises as told by your health care provider or physical therapist. General instructions  If you were given a soft collar, wear it as told by your health care provider.  Use a flat pillow when you sleep.  Keep all follow-up visits as told by your health care provider. This is important. Contact a health  care provider if:  Your condition does not improve with treatment. Get help right away if:  Your pain gets much worse and cannot be controlled with medicines.  You have weakness or numbness in your hand, arm, face, or leg.  You have a high fever.  You have a stiff, rigid neck.  You lose control of your bowels or your bladder (have incontinence).  You have trouble with walking, balance, or speaking. This information is not intended to replace advice given to  you by your health care provider. Make sure you discuss any questions you have with your health care provider. Document Released: 09/24/2000 Document Revised: 06/07/2015 Document Reviewed: 02/23/2014 Elsevier Interactive Patient Education  [employer].    If you have lab work done today you will be contacted with your lab results within the next 2 weeks.  If you have not heard from [place] then please contact us. The fastest way to get your results is to register for My Chart.   IF you received an x-ray today, you will receive an invoice from [facility]. Please contact [facility] at [phone] with questions or concerns regarding your invoice.   IF you received labwork today, you will receive an invoice from [facility]. Please contact [facility] at [phone] with questions or concerns regarding your invoice.   Our billing staff will not be able to assist you with questions regarding bills from these companies.  You will be contacted with the lab results as soon as they are available. The fastest way to get your results is to activate your My Chart account. Instructions are located on the last page of this paperwork. If you have not heard from [place] regarding the results in 2 weeks, please contact this office.

## 2018-01-14 NOTE — Progress Notes (Signed)
Subjective:    Patient ID: Janet Kramer, female    DOB: 1945/06/04, 73 y.o.   MRN: [id number]  HPI Janet Kramer is a 73 y.o. female Presents today for: Chief Complaint  Patient presents with  . left shoulder pain    x5day. off and on the pain    Upper shoulder pain into shoulder blade area, under arm, and down into left arm.  Started with left sided chest pain with deep breath a few weeks ago, but not short of breath. No recent long car ride or air travel, no preceeding calf pain/swelling. Pain went away if lifted left arm upward. Chest symptoms resolved. No further pleuritic pain (and resolved with arm elevation).  Then noted soreness in left shoulder off and on past 2 weeks, then worse over past 1 week. No associated chest pain/diaphoresis/n/v. Pain radiates around back of left arm, sometimes to shoulder blade Treated with calcium, heat, aspirin. Improved with aspirin, but stopped aspirin after noted some lightheadedness past week or so. Had been using ASA 3 times per week usually, then twice daily for symptoms relief.  Noted lightheadedness with standing past 3-4 days.  No syncope.  Noticed dizziness with looking up as well. No dark stools, no abd pain or blood in stool.  Soreness in arm after movement or hanging down. Noticed same pain in left shoulder after turning arm to the right on 12/24.   No FH of heart disease. No personal FH of heart disease.    There are no active problems to display for this patient.  Past Medical History:  Diagnosis Date  . Allergy   . Arthritis   . Asthma   . Bronchitis    has recurrent bronchitis  . Cataract   . Glaucoma   . Hyperlipidemia    Past Surgical History:  Procedure Laterality Date  . [place]   right  . FRACTURE SURGERY  2001   ankle - right     . ROTATOR CUFF REPAIR    . TONSILLECTOMY  1949   Allergies  Allergen Reactions  . Codeine     Overly sleepy  . Oxycontin [Oxycodone Hcl] Nausea And Vomiting   Prior to  Admission medications   Medication Sig Start Date End Date Taking? Authorizing Provider  aspirin 325 MG tablet Take 325 mg by mouth every 6 (six) hours as needed.    Yes [provider]  latanoprost (XALATAN) 0.005 % ophthalmic solution 1 drop at bedtime.   Yes [provider]   Social History   Socioeconomic History  . Marital status: Married    Spouse name: Not on file  . Number of children: 2  . Years of education: Not on file  . Highest education level: Not on file  Occupational History  . Occupation: Merchant navy officer: [employer]  Social Needs  . Financial resource strain: Not on file  . Food insecurity:    Worry: Not on file    Inability: Not on file  . Transportation needs:    Medical: Not on file    Non-medical: Not on file  Tobacco Use  . Smoking status: Never Smoker  . Smokeless tobacco: Never Used  Substance and Sexual Activity  . Alcohol use: Yes    Comment: occasional  . Drug use: No  . Sexual activity: Not on file  Lifestyle  . Physical activity:    Days per week: Not on file    Minutes  per session: Not on file  . Stress: Not on file  Relationships  . Social connections:    Talks on phone: Not on file    Gets together: Not on file    Attends religious service: Not on file    Active member of club or organization: Not on file    Attends meetings of clubs or organizations: Not on file    Relationship status: Not on file  . Intimate partner violence:    Fear of current or ex partner: Not on file    Emotionally abused: Not on file    Physically abused: Not on file    Forced sexual activity: Not on file  Other Topics Concern  . 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      ADLs: independent with ADLs.      Advanced Directives: NO; FULL CODE; no prolonged measures; HCPOA: daughter not husband; [name].    Review of Systems     Objective:   Physical Exam Vitals signs reviewed.  Constitutional:      Appearance: She is well-developed.  HENT:     Head: Normocephalic and atraumatic.  Eyes:     Conjunctiva/sclera: Conjunctivae normal.     Pupils: Pupils are equal, round, and reactive to light.  Neck:     Vascular: No carotid bruit.  Cardiovascular:     Rate and Rhythm: Normal rate and regular rhythm.     Heart sounds: Normal heart sounds.  Pulmonary:     Effort: Pulmonary effort is normal.     Breath sounds: Normal breath sounds.  Chest:     Comments: Chest wall nontender, no axillary pain, but surge of pain at rest after ROM of shoulder.  Abdominal:     Palpations: Abdomen is soft. There is no pulsatile mass.     Tenderness: There is no abdominal tenderness.  Musculoskeletal:     Left shoulder: She exhibits normal range of motion (from, but no painwith movement. notes pain into arm a few seconds after  ROM. surge  of soreness. ), no tenderness, no bony tenderness (no ac/[place] ttp. ) and normal strength (full/pain free rtc strength. ).     Cervical back: She exhibits decreased range of motion (slight decr ROM, but notes some tightness into left shoulder blade with flexion,, but not down left  arm. ). She exhibits no tenderness and no bony tenderness.  Skin:    General: Skin is warm and dry.  Neurological:     Mental Status: She is alert and oriented to person, place, and time.  Psychiatric:        Behavior: Behavior normal.    Fleeting soreness into left chest for few seconds after movement, then ache in left chest wall that was reproducible on exam.  Vitals:   01/14/18 1632  BP: 134/80  Pulse: 86  Temp: 97.6 F (36.4 C)  TempSrc: Oral  SpO2: 98%  Weight: 150 lb 6.4 oz  (68.2 kg)  Height: 5\' 3"  (1.6 m)    Dg Chest 2 View  Result  Date: 01/14/2018 CLINICAL DATA:  Chest pain with intermittent dizziness. EXAM: CHEST - 2 VIEW COMPARISON:  None. FINDINGS: Cardiac silhouette is normal in size. No mediastinal or hilar masses. No evidence of adenopathy. Clear lungs.  No pleural effusion or pneumothorax. Skeletal structures are intact. IMPRESSION: No active cardiopulmonary disease. Electronically Signed   By: Lajean Manes M.D.   On: 01/14/2018 18:26   Dg Cervical Spine 2 Or 3 Views  Result Date: 01/14/2018 CLINICAL DATA:  Neck pain EXAM: CERVICAL SPINE - 2-3 VIEW COMPARISON:  None. FINDINGS: Alignment within normal limits. Vertebral body heights are normal. Normal prevertebral soft tissue thickness. Dens and lateral masses are unremarkable. Mild degenerative change at C3-C4 and C4-C5 with moderate to marked degenerative change C5-C6 and C6-C7. IMPRESSION: Mild to moderate diffuse degenerative changes without acute osseous abnormality Electronically Signed   By: Donavan Foil M.D.   On: 01/14/2018 17:22   Dg Shoulder Left  Result Date: 01/14/2018 CLINICAL DATA:  Neck to left shoulder and arm pain EXAM: LEFT SHOULDER - 2+ VIEW COMPARISON:  None. FINDINGS: There is no evidence of fracture or dislocation. There is no evidence of arthropathy or other focal bone abnormality. Soft tissues are unremarkable. IMPRESSION: Negative. Electronically Signed   By: Donavan Foil M.D.   On: 01/14/2018 17:22   EKG: Sinus rhythm.  Rate 87.  Incomplete RBBB, LAFB.  RSR prime in V2, no apparent acute ST/T wave changes.  Results for orders placed or performed in visit on 01/14/18  POCT CBC  Result Value Ref Range   WBC 6.0 4.6 - 10.2 K/uL   Lymph, poc 0.9 0.6 - 3.4   POC LYMPH PERCENT 14.9 10 - 50 %L   MID (cbc) 0.7 0 - 0.9   POC MID % 11.9 0 - 12 %M   POC Granulocyte 4.4 2 - 6.9   Granulocyte percent 73.2 37 - 80 %G   RBC 4.58 4.04 - 5.48 M/uL   Hemoglobin 14.3 11 - 14.6 g/dL   HCT,  POC 42.1 (A) 29 - 41 %   MCV 91.8 76 - 111 fL   MCH, POC 31.2 27 - 31.2 pg   MCHC 34.0 31.8 - 35.4 g/dL   RDW, POC 13.1 %   Platelet Count, POC 254 142 - 424 K/uL   MPV 6.9 0 - 99.8 fL   Over 40 minutes face to face time with history and greater than 50% counseling.     Assessment & Plan:    Janet Kramer is a 73 y.o. female Pain in joint of left shoulder - Plan: DG Shoulder Left  Neck pain - Plan: DG Cervical Spine 2 or 3 views  Chest pain, unspecified type - Plan: DG Chest 2 View, EKG 12-Lead, Ambulatory referral to Cardiology, CANCELED: D-dimer, quantitative (not at [facility])  Dizziness - Plan: POCT CBC, POCT glucose (manual entry)  Chest wall pain  Radicular pain in left arm - Plan: predniSONE (DELTASONE) 20 MG tablet  Initial episode of pleuritic left upper chest pain that resolved, but at the time would resolve with left arm elevation, suspected neuronal impingement.  Followed by onset of left shoulder pain that appears to be more radicular in nature including with onset of discomfort after range of motion testing in office.  Nonspecific abnormal EKG with probable RBBB, LAFB, but no apparent acute ST/T wave changes.  Additionally her left arm pain is brought on by movement/after movement and no current chest pain.  Less likely cardiac source, unlikely PE given  no persistent chest pain, dyspnea, and no preceding calf swelling/pain nor known DVT/PE risk factors.  -We will refer to cardiology to evaluate the previous chest discomfort and EKG but 911/ER precautions discussed if any acute worsening symptoms or return of chest pain.  -For likely cervical radiculopathy will start prednisone 40 mg daily, potential side effects and risk discussed.  -After discussion of medication allergies decided to try Tylenol over-the-counter initially  -Recheck in 48 hours, ER/911 precautions if worsening sooner.   CBC reassuring, glucose reassuring and normal blood pressure from dizziness standpoint.   Did note some dizziness with standing quickly in the past, may have component of orthostasis.  RTC/ER precautions if acute worsening, and recheck in 48 hours.  Meds ordered this encounter  Medications  . predniSONE (DELTASONE) 20 MG tablet    Sig: Take 2 tablets (40 mg total) by mouth daily with breakfast.    Dispense:  10 tablet    Refill:  0   Patient Instructions    X-rays were overall reassuring.  Neck x-ray did show some arthritis, and based on your exam and history you could have a pinched nerve that is traveling to the upper back and arm.  Can start with prednisone for now, Tylenol over-the-counter as needed and recheck in 2 days.  There were some possible abnormalities on EKG but I do not see anything acute.  I will refer you to cardiology but if any return of chest pain, shortness of breath or pain with breathing, or other worsening symptoms proceed directly to the emergency room or call 911. Return to the clinic or go to the nearest emergency room if any of your symptoms worsen or new symptoms occur.  Cervical Radiculopathy  Cervical radiculopathy happens when a nerve in the neck (cervical nerve) is pinched or bruised. This condition can develop because of an injury or as part of the normal aging process. Pressure on the cervical nerves can cause pain or numbness that runs from the neck all the way down into the arm and fingers. Usually, this condition gets better with rest. Treatment may be needed if the condition does not improve. What are the causes? This condition may be caused by:  Injury.  Slipped (herniated) disk.  Muscle tightness in the neck because of overuse.  Arthritis.  Breakdown or degeneration in the bones and joints of the spine (spondylosis) due to aging.  Bone spurs that may develop near the cervical nerves. What are the signs or symptoms? Symptoms of this condition include:  Pain that runs from the neck to the arm and hand. The pain can be severe or  irritating. It may be worse when the neck is moved.  Numbness or weakness in the affected arm and hand. How is this diagnosed? This condition may be diagnosed based on symptoms, medical history, and a physical exam. You may also have tests, including:  X-rays.  CT scan.  MRI.  Electromyogram (EMG).  Nerve conduction tests. How is this treated? In many cases, treatment is not needed for this condition. With rest, the condition usually gets better over time. If treatment is needed, options may include:  Wearing a soft neck collar for short periods of time.  Physical therapy to strengthen your neck muscles.  Medicines, such as NSAIDs, oral corticosteroids, or spinal injections.  Surgery. This may be needed if other treatments do not help. Various types of surgery may be done depending on the cause of your problems. Follow these instructions at home: Managing pain  Take over-the-counter and prescription medicines only as told by your health care provider.  If directed, apply ice to the affected area. ? Put ice in a plastic bag. ? Place a towel between your skin and the bag. ? Leave the ice on for 20 minutes, 2-3 times per day.  If ice does not help, you can try using heat. Take a warm shower or warm bath, or use a heat pack as told by your health care provider.  Try a gentle neck and shoulder massage to help relieve symptoms. Activity  Rest as needed. Follow instructions from your health care provider about any restrictions on activities.  Do stretching and strengthening exercises as told by your health care provider or physical therapist. General instructions  If you were given a soft collar, wear it as told by your health care provider.  Use a flat pillow when you sleep.  Keep all follow-up visits as told by your health care provider. This is important. Contact a health care provider if:  Your condition does not improve with treatment. Get help right away  if:  Your pain gets much worse and cannot be controlled with medicines.  You have weakness or numbness in your hand, arm, face, or leg.  You have a high fever.  You have a stiff, rigid neck.  You lose control of your bowels or your bladder (have incontinence).  You have trouble with walking, balance, or speaking. This information is not intended to replace advice given to you by your health care provider. Make sure you discuss any questions you have with your health care provider. Document Released: 09/24/2000 Document Revised: 06/07/2015 Document Reviewed: 02/23/2014 Elsevier Interactive Patient Education  [employer].    If you have lab work done today you will be contacted with your lab results within the next 2 weeks.  If you have not heard from [place] then please contact us. The fastest way to get your results is to register for My Chart.   IF you received an x-ray today, you will receive an invoice from [facility]. Please contact [facility] at [phone] with questions or concerns regarding your invoice.   IF you received labwork today, you will receive an invoice from [place]. Please contact [facility] at [phone] with questions or concerns regarding your invoice.   Our billing staff will not be able to assist you with questions regarding bills from these companies.  You will be contacted with the lab results as soon as they are available. The fastest way to get your results is to activate your My Chart account. Instructions are located on the last page of this paperwork. If you have not heard from [place] regarding the results in 2 weeks, please contact this office.       Signed,   Merri Ray, MD [facility] [place].  01/14/18 6:58 PM

## 2018-01-14 NOTE — Telephone Encounter (Signed)
Per Dr. Carlota Raspberry please schedule the pt at 8:40 am with Dr .Pamella Pert on 01/16/18

## 2018-01-16 ENCOUNTER — Ambulatory Visit: Payer: Medicare HMO | Admitting: Family Medicine

## 2018-01-16 ENCOUNTER — Other Ambulatory Visit: Payer: Self-pay

## 2018-01-16 ENCOUNTER — Encounter: Payer: Self-pay | Admitting: Family Medicine

## 2018-01-16 VITALS — BP 138/78 | HR 90 | Temp 97.8°F | Resp 18 | Ht 63.0 in | Wt 152.0 lb

## 2018-01-16 DIAGNOSIS — R42 Dizziness and giddiness: Secondary | ICD-10-CM | POA: Diagnosis not present

## 2018-01-16 DIAGNOSIS — M25512 Pain in left shoulder: Secondary | ICD-10-CM

## 2018-01-16 DIAGNOSIS — I951 Orthostatic hypotension: Secondary | ICD-10-CM | POA: Diagnosis not present

## 2018-01-16 NOTE — Patient Instructions (Addendum)
If you have lab work done today you will be contacted with your lab results within the next 2 weeks.  If you have not heard from [place] then please contact us. The fastest way to get your results is to register for My Chart.   IF you received an x-ray today, you will receive an invoice from [facility]. Please contact [facility] at [phone] with questions or concerns regarding your invoice.   IF you received labwork today, you will receive an invoice from [facility]. Please contact [facility] at [phone] with questions or concerns regarding your invoice.   Our billing staff will not be able to assist you with questions regarding bills from these companies.  You will be contacted with the lab results as soon as they are available. The fastest way to get your results is to activate your My Chart account. Instructions are located on the last page of this paperwork. If you have not heard from [place] regarding the results in 2 weeks, please contact this office.     Orthostatic Hypotension Blood pressure is a measurement of how strongly, or weakly, your blood is pressing against the walls of your arteries. Orthostatic hypotension is a sudden drop in blood pressure that happens when you quickly change positions, such as when you get up from sitting or lying down. Arteries are blood vessels that carry blood from your heart throughout your body. When blood pressure is too low, you may not get enough blood to your brain or to the rest of your organs. This can cause weakness, light-headedness, rapid heartbeat, and fainting. This can last for just a few seconds or for up to a few minutes. Orthostatic hypotension is usually not a serious problem. However, if it happens frequently or gets worse, it may be a sign of something more serious. What are the causes? This condition may be caused by:  Sudden changes in posture, such as standing up quickly after you have been sitting or lying  down.  Blood loss.  Loss of body fluids (dehydration).  Heart problems.  Hormone (endocrine) problems.  Pregnancy.  Severe infection.  Lack of certain nutrients.  Severe allergic reactions (anaphylaxis).  Certain medicines, such as blood pressure medicine or medicines that make the body lose excess fluids (diuretics). Sometimes, this condition can be caused by not taking medicine as directed, such as taking too much of a certain medicine. What increases the risk? The following factors may make you more likely to develop this condition:  Age. Risk increases as you get older.  Conditions that affect the heart or the central nervous system.  Taking certain medicines, such as blood pressure medicine or diuretics.  Being pregnant. What are the signs or symptoms? Symptoms of this condition may include:  Weakness.  Light-headedness.  Dizziness.  Blurred vision.  Fatigue.  Rapid heartbeat.  Fainting, in severe cases. How is this diagnosed? This condition is diagnosed based on:  Your medical history.  Your symptoms.  Your blood pressure measurement. Your health care provider will check your blood pressure when you are: ? Lying down. ? Sitting. ? Standing. A blood pressure reading is recorded as two numbers, such as "120 over 80" (or 120/80). The first ("top") number is called the systolic pressure. It is a measure of the pressure in your arteries as your heart beats. The second ("bottom") number is called the diastolic pressure. It is a measure of the pressure in your arteries when your heart relaxes between beats. Blood  pressure is measured in a unit called mm Hg. Healthy blood pressure for most adults is 120/80. If your blood pressure is below 90/60, you may be diagnosed with hypotension. Other information or tests that may be used to diagnose orthostatic hypotension include:  Your other vital signs, such as your heart rate and temperature.  Blood tests.  Tilt  table test. For this test, you will be safely secured to a table that moves you from a lying position to an upright position. Your heart rhythm and blood pressure will be monitored during the test. How is this treated? This condition may be treated by:  Changing your diet. This may involve eating more salt (sodium) or drinking more water.  Taking medicines to raise your blood pressure.  Changing the dosage of certain medicines you are taking that might be lowering your blood pressure.  Wearing compression stockings. These stockings help to prevent blood clots and reduce swelling in your legs. In some cases, you may need to go to the hospital for:  Fluid replacement. This means you will receive fluids through an IV.  Blood replacement. This means you will receive donated blood through an IV (transfusion).  Treating an infection or heart problems, if this applies.  Monitoring. You may need to be monitored while medicines that you are taking wear off. Follow these instructions at home: Eating and drinking   Drink enough fluid to keep your urine pale yellow.  Eat a healthy diet, and follow instructions from your health care provider about eating or drinking restrictions. A healthy diet includes: ? Fresh fruits and vegetables. ? Whole grains. ? Lean meats. ? Low-fat dairy products.  Eat extra salt only as directed. Do not add extra salt to your diet unless your health care provider told you to do that.  Eat frequent, small meals.  Avoid standing up suddenly after eating. Medicines  Take over-the-counter and prescription medicines only as told by your health care provider. ? Follow instructions from your health care provider about changing the dosage of your current medicines, if this applies. ? Do not stop or adjust any of your medicines on your own. General instructions   Wear compression stockings as told by your health care provider.  Get up slowly from lying down or  sitting positions. This gives your blood pressure a chance to adjust.  Avoid hot showers and excessive heat as directed by your health care provider.  Return to your normal activities as told by your health care provider. Ask your health care provider what activities are safe for you.  Do not use any products that contain nicotine or tobacco, such as cigarettes, e-cigarettes, and chewing tobacco. If you need help quitting, ask your health care provider.  Keep all follow-up visits as told by your health care provider. This is important. Contact a health care provider if you:  Vomit.  Have diarrhea.  Have a fever for more than 2-3 days.  Feel more thirsty than usual.  Feel weak and tired. Get help right away if you:  Have chest pain.  Have a fast or irregular heartbeat.  Develop numbness in any part of your body.  Cannot move your arms or your legs.  Have trouble speaking.  Become sweaty or feel light-headed.  Faint.  Feel short of breath.  Have trouble staying awake.  Feel confused. Summary  Orthostatic hypotension is a sudden drop in blood pressure that happens when you quickly change positions.  Orthostatic hypotension is usually not a serious  problem.  It is diagnosed by having your blood pressure taken lying down, sitting, and then standing.  It may be treated by changing your diet or adjusting your medicines. This information is not intended to replace advice given to you by your health care provider. Make sure you discuss any questions you have with your health care provider. Document Released: 12/20/2001 Document Revised: 06/25/2017 Document Reviewed: 06/25/2017 Elsevier Interactive Patient Education  [employer].

## 2018-01-16 NOTE — Progress Notes (Signed)
1/4/20209:25 AM  Janet Kramer 08-12-1945, 73 y.o. female [id number]  Chief Complaint  Patient presents with  . Shoulder Pain    follow up left shoulder     HPI:   Patient is a 73 y.o. female who presents today for followup on left shoulder pain  Seen 2 days ago by Dr Carlota Raspberry - thought to be cervical radiculopathy, started on pred 40mg  daily. Concern for dizziness and pleuritic chest pain that started prior to shoulder pain and had since then resolved when seen by Dr Carlota Raspberry, EKG non acute, referred to cards  Shoulder pain is better, not resolved She also got a flat pillow Mobility is normal Tolerating pred well  Dizziness persists, lightheaded briefly upon standing quickly  Fall Risk  01/16/2018 01/14/2018 05/24/2016 01/16/2016 07/22/2013  Falls in the past year? 0 0 No No No     Depression screen [facility] 2/9 01/16/2018 01/14/2018 05/24/2016  Decreased Interest 0 0 0  Down, Depressed, Hopeless 0 0 0  PHQ - 2 Score 0 0 0    Allergies  Allergen Reactions  . Codeine     Overly sleepy  . Oxycontin [Oxycodone Hcl] Nausea And Vomiting    Prior to Admission medications   Medication Sig Start Date End Date Taking? Authorizing Provider  aspirin 325 MG tablet Take 325 mg by mouth every 6 (six) hours as needed.    Yes [provider]  latanoprost (XALATAN) 0.005 % ophthalmic solution 1 drop at bedtime.   Yes [provider]  predniSONE (DELTASONE) 20 MG tablet Take 2 tablets (40 mg total) by mouth daily with breakfast. 01/14/18  Yes Wendie Agreste, MD    Past Medical History:  Diagnosis Date  . Allergy   . Arthritis   . Asthma   . Bronchitis    has recurrent bronchitis  . Cataract   . Glaucoma   . Hyperlipidemia     Past Surgical History:  Procedure Laterality Date  . [place]   right  . FRACTURE SURGERY  2001   ankle - right     . ROTATOR CUFF REPAIR    . TONSILLECTOMY  1949    Social History   Tobacco Use  . Smoking status: Never Smoker  .  Smokeless tobacco: Never Used  Substance Use Topics  . Alcohol use: Yes    Comment: occasional    Family History  Problem Relation Age of Onset  . Cancer Mother        colon  . Colon cancer Mother 61  . Cancer Father        lung  . Diabetes Paternal Grandmother   . Stroke Maternal Grandmother   . Heart disease Maternal Grandfather   . Stomach cancer Neg Hx     ROS Per hpi  OBJECTIVE:  Blood pressure 138/78, pulse 90, temperature 97.8 F (36.6 C), temperature source Oral, resp. rate 18, height 5\' 3"  (1.6 m), weight 152 lb (68.9 kg), SpO2 99 %. Body mass index is 26.93 kg/m.   BP Readings from Last 3 Encounters:  01/16/18 138/78  01/14/18 134/80  05/24/16 127/75    Orthostatic VS for the past 24 hrs (Last 3 readings):  BP- Lying Pulse- Lying BP- Standing at 0 minutes Pulse- Standing at 0 minutes BP- Standing at 3 minutes Pulse- Standing at 3 minutes  01/16/18 0946 128/80 76 135/80 95 124/85 93    Physical Exam Vitals signs and nursing note reviewed.  Constitutional:  Appearance: She is well-developed.  HENT:     Head: Normocephalic and atraumatic.     Mouth/Throat:     Pharynx: No oropharyngeal exudate.  Eyes:     General: No scleral icterus.    Conjunctiva/sclera: Conjunctivae normal.     Pupils: Pupils are equal, round, and reactive to light.  Neck:     Musculoskeletal: Normal range of motion and neck supple. No muscular tenderness.  Cardiovascular:     Rate and Rhythm: Normal rate and regular rhythm.     Heart sounds: Normal heart sounds. No murmur. No friction rub. No gallop.   Pulmonary:     Effort: Pulmonary effort is normal.     Breath sounds: Normal breath sounds. No wheezing or rales.  Musculoskeletal:     Left shoulder: She exhibits pain (mild pain along medial scapular border after FROM). She exhibits normal range of motion, no tenderness and no spasm.  Skin:    General: Skin is warm and dry.  Neurological:     Mental Status: She is alert  and oriented to person, place, and time.     ASSESSMENT and PLAN  1. Pain in joint of left shoulder Improving. Discussed cont pred. Gentle stretching. Will cont to monitor. Consider PT vs ortho referral  2. Dizziness 3. Orthostatic hypotension Discussed pushing fluids, being mindful with movement. Patient educational handout given. Keep appt with cards as referred by Dr Carlota Raspberry - Orthostatic vital signs   Return for after cards.    Rutherford Guys, MD [facility] [place], [place] [address] Ph.  [phone] Fax [phone]

## 2018-01-19 ENCOUNTER — Encounter: Payer: Self-pay | Admitting: Cardiovascular Disease

## 2018-01-19 ENCOUNTER — Ambulatory Visit: Payer: 59 | Admitting: Cardiovascular Disease

## 2018-01-19 DIAGNOSIS — E782 Mixed hyperlipidemia: Secondary | ICD-10-CM

## 2018-01-19 DIAGNOSIS — M79602 Pain in left arm: Secondary | ICD-10-CM

## 2018-01-19 DIAGNOSIS — E785 Hyperlipidemia, unspecified: Secondary | ICD-10-CM | POA: Insufficient documentation

## 2018-01-19 NOTE — Assessment & Plan Note (Signed)
Janet Kramer was referred to me by Dr. Nyoka Cowden because of left arm pain.  This is been present for last several weeks.  Radiates to her shoulder and deltoid area.  It sounds radicular and noncardiac.  No further work-up indicated at this time.

## 2018-01-19 NOTE — Progress Notes (Signed)
01/19/2018 Janet Kramer   09-10-1945  [id number]  Primary Physician Rutherford Guys, MD Primary Cardiologist: Lorretta Harp MD Janet Kramer, [name]  HPI:  Janet Kramer is a 73 y.o. 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  She was referred by Dr. Nyoka Cowden for cardiovascular valuation because of left arm pain.  Her husband [name] is also a patient of mine.  She has no cardiac risk factors except for mild hyperlipidemia.  She is never had a heart attack or stroke.  She is had left arm pain for last 2 weeks which is radicular in nature.  There are no cardiac symptoms associated with this.   Current Meds  Medication Sig  . aspirin 325 MG tablet Take 325 mg by mouth every 6 (six) hours as needed.   . latanoprost (XALATAN) 0.005 % ophthalmic solution 1 drop at bedtime.   Current Facility-Administered Medications for the 01/19/18 encounter (Office Visit) with Lorretta Harp, MD  Medication  . triamcinolone acetonide (KENALOG-40) injection 40 mg     Allergies  Allergen Reactions  . Codeine     Overly sleepy  . Oxycontin [Oxycodone Hcl] Nausea And Vomiting    Social History   Socioeconomic History  . Marital status: Married    Spouse name: Not on file  . Number of children: 2  . Years of education: Not on file  . Highest education level: Not on file  Occupational History  . Occupation: Merchant navy officer: [employer]  Social Needs  . Financial resource strain: Not on file  . Food insecurity:    Worry: Not on file    Inability: Not on file  . Transportation needs:    Medical: Not on file    Non-medical: Not on file  Tobacco Use  . Smoking status: Never Smoker  . Smokeless tobacco: Never Used  Substance and Sexual Activity  . Alcohol use: Yes    Comment: occasional  . Drug use: No  . Sexual activity: Not on file  Lifestyle  . Physical activity:     Days per week: Not on file    Minutes per session: Not on file  . Stress: Not on file  Relationships  . Social connections:    Talks on phone: Not on file    Gets together: Not on file    Attends religious service: Not on file    Active member of club or organization: Not on file    Attends meetings of clubs or organizations: Not on file    Relationship status: Not on file  . Intimate partner violence:    Fear of current or ex partner: Not on file    Emotionally abused: Not on file    Physically abused: Not on file    Forced sexual activity: Not on file  Other Topics Concern  . 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      ADLs: independent with  ADLs.      Advanced Directives: NO; FULL CODE; no prolonged measures; HCPOA: daughter not husband; [name].     Review of Systems: General: negative for chills, fever, night sweats or weight changes.  Cardiovascular: negative for chest pain, dyspnea on exertion, edema, orthopnea, palpitations, paroxysmal nocturnal dyspnea or shortness of breath Dermatological: negative for rash Respiratory: negative for cough or wheezing Urologic: negative for hematuria Abdominal: negative for nausea, vomiting, diarrhea, bright red blood per rectum, melena, or hematemesis Neurologic: negative for visual changes, syncope, or dizziness All other systems reviewed and are otherwise negative except as noted above.    Blood pressure 130/70, pulse 84, height 5\' 3"  (1.6 m), weight 152 lb (68.9 kg).  General appearance: alert and no distress Neck: no adenopathy, no carotid bruit, no JVD, supple, symmetrical, trachea midline and thyroid not  enlarged, symmetric, no tenderness/mass/nodules Lungs: clear to auscultation bilaterally Heart: regular rate and rhythm, S1, S2 normal, no murmur, click, rub or gallop Extremities: extremities normal, atraumatic, no cyanosis or edema Pulses: 2+ and symmetric Skin: Skin color, texture, turgor normal. No rashes or lesions Neurologic: Alert and oriented X 3, normal strength and tone. Normal symmetric reflexes. Normal coordination and gait  EKG not performed today  ASSESSMENT AND PLAN:   Left arm pain Ms. Bickford was referred to me by Dr. Nyoka Cowden because of left arm pain.  This is been present for last several weeks.  Radiates to her shoulder and deltoid area.  It sounds radicular and noncardiac.  No further work-up indicated at this time.  Hyperlipidemia Hyperlipidemia with cholesterol drawn 01/16/2016 revealing total cholesterol 269, LDL 164 and HDL 77.  Her ratio is acceptable she does admit to dietary indiscretion.      Lorretta Harp MD FACP,FACC,FAHA, [facility] 01/19/2018 4:32 PM

## 2018-01-19 NOTE — Patient Instructions (Signed)
Medication Instructions:  NO CHANGES If you need a refill on your cardiac medications before your next appointment, please call your pharmacy.   Lab work: NONE If you have labs (blood work) drawn today and your tests are completely normal, you will receive your results only by: Marland Kitchen MyChart Message (if you have MyChart) OR . A paper copy in the mail If you have any lab test that is abnormal or we need to change your treatment, we will call you to review the results.  Testing/Procedures: NONE  Follow-Up: At [facility], you and your health needs are our priority.  As part of our continuing mission to provide you with exceptional heart care, we have created designated Provider Care Teams.  These Care Teams include your primary Cardiologist (physician) and Advanced Practice Providers (APPs -  Physician Assistants and Nurse Practitioners) who all work together to provide you with the care you need, when you need it. . You MAY SCHEDULE a follow up appointment AS NEEDED.  Please call our office 2 months in advance to schedule this appointment.  You may see Dr. Gwenlyn Found or one of the following Advanced Practice Providers on your designated Care Team:   . Kerin Ransom, [place] . Almyra Deforest, PA-C . Fabian Sharp, PA-C . Jory Sims, DNP . Rosaria Ferries, PA-C . Roby Lofts, PA-C . Sande Rives, PA-C

## 2018-01-19 NOTE — Assessment & Plan Note (Signed)
Hyperlipidemia with cholesterol drawn 01/16/2016 revealing total cholesterol 269, LDL 164 and HDL 77.  Her ratio is acceptable she does admit to dietary indiscretion.

## 2018-03-11 ENCOUNTER — Encounter: Payer: Self-pay | Admitting: Gastroenterology

## 2018-03-19 ENCOUNTER — Encounter: Payer: Self-pay | Admitting: Gastroenterology

## 2018-03-19 ENCOUNTER — Ambulatory Visit (AMBULATORY_SURGERY_CENTER): Payer: Self-pay | Admitting: *Deleted

## 2018-03-19 VITALS — Ht 64.0 in | Wt 152.8 lb

## 2018-03-19 DIAGNOSIS — Z800 Family history of malignant neoplasm of digestive organs: Secondary | ICD-10-CM

## 2018-03-19 MED ORDER — SUPREP BOWEL PREP KIT 17.5-3.13-1.6 GM/177ML PO SOLN: 1.0000 | Freq: Once | ORAL | 0 refills | Status: AC

## 2018-03-19 NOTE — Progress Notes (Signed)
No egg or soy allergy known to patient  No issues with past sedation with any surgeries  or procedures, no intubation problems  No diet pills per patient No home 02 use per patient  No blood thinners per patient  Pt denies issues with constipation  No A fib or A flutter  EMMI video  Offered and declined by the patient. 

## 2018-04-02 ENCOUNTER — Encounter: Payer: Self-pay | Admitting: Gastroenterology

## 2018-05-13 ENCOUNTER — Telehealth: Payer: Self-pay | Admitting: *Deleted

## 2018-05-13 NOTE — Telephone Encounter (Signed)
Per Dr. Fuller Plan, ok to schedule this pt's procedure.  Colonoscopy scheduled on 05-25-18 at 9:30.  New instructions mailed to pt.  Pt states she has prep at home already.

## 2018-05-13 NOTE — Telephone Encounter (Signed)
Sent via MyChart

## 2018-05-24 ENCOUNTER — Telehealth: Payer: Self-pay | Admitting: *Deleted

## 2018-05-24 NOTE — Telephone Encounter (Signed)
Covid-19 travel screening questions  Have you traveled in the last 14 days? No If yes where?  Do you now or have you had a fever in the last 14 days? No  Do you have any respiratory symptoms of shortness of breath or cough now or in the last 14 days? No  Do you have any family members or close contacts with diagnosed or suspected Covid-19? No       

## 2018-05-25 ENCOUNTER — Other Ambulatory Visit: Payer: Self-pay

## 2018-05-25 ENCOUNTER — Ambulatory Visit (AMBULATORY_SURGERY_CENTER): Payer: Medicare HMO | Admitting: Gastroenterology

## 2018-05-25 ENCOUNTER — Encounter: Payer: Self-pay | Admitting: Gastroenterology

## 2018-05-25 VITALS — BP 122/82 | HR 74 | Temp 97.8°F | Resp 15 | Ht 64.0 in | Wt 152.0 lb

## 2018-05-25 DIAGNOSIS — Z1211 Encounter for screening for malignant neoplasm of colon: Secondary | ICD-10-CM

## 2018-05-25 DIAGNOSIS — Z800 Family history of malignant neoplasm of digestive organs: Secondary | ICD-10-CM | POA: Diagnosis not present

## 2018-05-25 DIAGNOSIS — Z8371 Family history of colonic polyps: Secondary | ICD-10-CM | POA: Diagnosis not present

## 2018-05-25 DIAGNOSIS — Z86010 Personal history of colonic polyps: Secondary | ICD-10-CM | POA: Diagnosis not present

## 2018-05-25 DIAGNOSIS — K635 Polyp of colon: Secondary | ICD-10-CM | POA: Diagnosis not present

## 2018-05-25 DIAGNOSIS — E7800 Pure hypercholesterolemia, unspecified: Secondary | ICD-10-CM | POA: Diagnosis not present

## 2018-05-25 DIAGNOSIS — J45909 Unspecified asthma, uncomplicated: Secondary | ICD-10-CM | POA: Diagnosis not present

## 2018-05-25 DIAGNOSIS — D123 Benign neoplasm of transverse colon: Secondary | ICD-10-CM

## 2018-05-25 DIAGNOSIS — R195 Other fecal abnormalities: Secondary | ICD-10-CM | POA: Diagnosis not present

## 2018-05-25 MED ORDER — SODIUM CHLORIDE 0.9 % IV SOLN: 500.0000 mL | Freq: Once | INTRAVENOUS | Status: AC

## 2018-05-25 NOTE — Patient Instructions (Signed)
YOU HAD AN ENDOSCOPIC PROCEDURE TODAY AT [facility]:   Refer to the procedure report that was given to you for any specific questions about what was found during the examination.  If the procedure report does not answer your questions, please call your gastroenterologist to clarify.  If you requested that your care partner not be given the details of your procedure findings, then the procedure report has been included in a sealed envelope for you to review at your convenience later.  YOU SHOULD EXPECT: Some feelings of bloating in the abdomen. Passage of more gas than usual.  Walking can help get rid of the air that was put into your GI tract during the procedure and reduce the bloating. If you had a lower endoscopy (such as a colonoscopy or flexible sigmoidoscopy) you may notice spotting of blood in your stool or on the toilet paper. If you underwent a bowel prep for your procedure, you may not have a normal bowel movement for a few days.  Please Note:  You might notice some irritation and congestion in your nose or some drainage.  This is from the oxygen used during your procedure.  There is no need for concern and it should clear up in a day or so.  SYMPTOMS TO REPORT IMMEDIATELY:   Following lower endoscopy (colonoscopy or flexible sigmoidoscopy):  Excessive amounts of blood in the stool  Significant tenderness or worsening of abdominal pains  Swelling of the abdomen that is new, acute  Fever of 100F or higher  Black, tarry-looking stools  For urgent or emergent issues, a gastroenterologist can be reached at any hour by calling [phone].   DIET:  We do recommend a small meal at first, but then you may proceed to your regular diet.  Drink plenty of fluids but you should avoid alcoholic beverages for 24 hours.  Try to eat more fiber, and drink a lot of water.  ACTIVITY:  You should plan to take it easy for the rest of today and you should NOT DRIVE or use heavy  machinery until tomorrow (because of the sedation medicines used during the test).    FOLLOW UP: Our staff will call the number listed on your records the next business day following your procedure to check on you and address any questions or concerns that you may have regarding the information given to you following your procedure. If we do not reach you, we will leave a message.  However, if you are feeling well and you are not experiencing any problems, there is no need to return our call.  We will assume that you have returned to your regular daily activities without incident. We will be calling you two weeks following your procedure to see if you have developed any symptoms of the COVID-19.  If you develop any symptoms before then, please let us know.  If any biopsies were taken you will be contacted by phone or by letter within the next 1-3 weeks.  Please call us at [phone] if you have not heard about the biopsies in 3 weeks.    SIGNATURES/CONFIDENTIALITY: You and/or your care partner have signed paperwork which will be entered into your electronic medical record.  These signatures attest to the fact that that the information above on your After Visit Summary has been reviewed and is understood.  Full responsibility of the confidentiality of this discharge information lies with you and/or your care-partner.  Read all of the handouts given to  you by  Your recovery room nurse.

## 2018-05-25 NOTE — Op Note (Signed)
[facility] Patient Name: Janet Kramer Procedure Date: 05/25/2018 9:54 AM MRN: [id number] Endoscopist: Ladene Artist , MD Age: 73 Referring MD:  Date of Birth: September 10, 1945 Gender: Female Account #: [id number] Procedure:                Colonoscopy Indications:              Colon cancer screening in patient at increased                            risk: Family history of 1st-degree relative with                            colon polyps before age 22 years, Screening in                            patient at increased risk: Family history of                            1st-degree relative with colorectal cancer Medicines:                Monitored Anesthesia Care Procedure:                Pre-Anesthesia Assessment:                           - Prior to the procedure, a History and Physical                            was performed, and patient medications and                            allergies were reviewed. The patient's tolerance of                            previous anesthesia was also reviewed. The risks                            and benefits of the procedure and the sedation                            options and risks were discussed with the patient.                            All questions were answered, and informed consent                            was obtained. Prior Anticoagulants: The patient has                            taken no previous anticoagulant or antiplatelet                            agents. ASA Grade Assessment: II - A patient with  mild systemic disease. After reviewing the risks                            and benefits, the patient was deemed in                            satisfactory condition to undergo the procedure.                           After obtaining informed consent, the colonoscope                            was passed under direct vision. Throughout the                            procedure, the patient's blood  pressure, pulse, and                            oxygen saturations were monitored continuously. The                            Colonoscope was introduced through the anus and                            advanced to the the cecum, identified by                            appendiceal orifice and ileocecal valve. The                            ileocecal valve, appendiceal orifice, and rectum                            were photographed. The quality of the bowel                            preparation was good. The colonoscopy was performed                            without difficulty. The patient tolerated the                            procedure well. Scope In: 10:09:35 AM Scope Out: 10:22:42 AM Scope Withdrawal Time: 0 hours 10 minutes 24 seconds  Total Procedure Duration: 0 hours 13 minutes 7 seconds  Findings:                 The perianal and digital rectal examinations were                            normal.                           A 5 mm polyp was found in the transverse colon. The  polyp was sessile. The polyp was removed with a                            cold biopsy forceps. Resection and retrieval were                            complete.                           Multiple medium-mouthed diverticula were found in                            the left colon. There was no evidence of                            diverticular bleeding.                           Internal hemorrhoids were found during                            retroflexion. The hemorrhoids were small and Grade                            I (internal hemorrhoids that do not prolapse).                           The exam was otherwise without abnormality on                            direct and retroflexion views. Complications:            No immediate complications. Estimated blood loss:                            None. Estimated Blood Loss:     Estimated blood loss: none. Impression:                - One 5 mm polyp in the transverse colon, removed                            with a cold biopsy forceps. Resected and retrieved.                           - Moderate diverticulosis in the left colon.                           - Internal hemorrhoids.                           - The examination was otherwise normal on direct                            and retroflexion views. Recommendation:           - Repeat colonoscopy in 5 years for  screening/surveillance purposes.                           - Patient has a contact number available for                            emergencies. The signs and symptoms of potential                            delayed complications were discussed with the                            patient. Return to normal activities tomorrow.                            Written discharge instructions were provided to the                            patient.                           - High fiber diet.                           - Continue present medications.                           - Await pathology results. Ladene Artist, MD 05/25/2018 10:30:18 AM This report has been signed electronically.

## 2018-05-25 NOTE — Progress Notes (Signed)
Called to room to assist during endoscopic procedure.  Patient ID and intended procedure confirmed with present staff. Received instructions for my participation in the procedure from the performing physician.  

## 2018-05-25 NOTE — Progress Notes (Signed)
Report given to PACU, vss 

## 2018-05-27 ENCOUNTER — Telehealth: Payer: Self-pay | Admitting: *Deleted

## 2018-05-27 NOTE — Telephone Encounter (Signed)
  Follow up Call-  Call back number 05/25/2018  Post procedure Call Back phone  # [phone]  Permission to leave phone message Yes  Some recent data might be hidden     Patient questions:  Do you have a fever, pain , or abdominal swelling? No. Pain Score  0 *  Have you tolerated food without any problems? Yes.    Have you been able to return to your normal activities? Yes.    Do you have any questions about your discharge instructions: Diet   No. Medications  No. Follow up visit  No.  Do you have questions or concerns about your Care? No.  Actions: * If pain score is 4 or above: No action needed, pain <4.  1. Have you developed a fever since your procedure? no  2.   Have you had an respiratory symptoms (SOB or cough) since your procedure? no  3.   Have you tested positive for COVID 19 since your procedure no  3.   Have you had any family members/close contacts diagnosed with the COVID 19 since your procedure?  no   If any of these questions are a yes, please inquire if patient has been seen by family doctor and route this note to Joylene John, Therapist, sports.

## 2018-06-01 ENCOUNTER — Encounter: Payer: Self-pay | Admitting: Gastroenterology

## 2018-06-11 ENCOUNTER — Telehealth: Payer: Self-pay | Admitting: Cardiovascular Disease

## 2018-06-11 NOTE — Telephone Encounter (Signed)
Spoke with pt. She state last night while sitting on the porch with her husband she began to become dizzy and the lightening outside looked weird. She state they got up to go in the house and could feel her heart was racing. Pt report symptoms lasted very several hours. She state she took her BP and HR every hour until she went to bed. Pt report BP ranged from 115/93-147/103 and HR 160-180. Pt state symptoms has since resolved and this morning BP was 90/60 and HR 70. Pt informed that a message will be routed to MD for recommendations but with it nearing the weekend, if symptoms reoccur to report to ER or Urgent Care for further evaluations. Pt voiced understanding.

## 2018-06-11 NOTE — Telephone Encounter (Signed)
New Message      STAT if HR is under 50 or over 120 (normal HR is 60-100 beats per minute)  1) What is your heart rate? Hr today low- mid 70's  2) Do you have a log of your heart rate readings (document readings)? Yes  3) Do you have any other symptoms? Started with dizziness last night the pt says her hr was beating really fast and she checked it and it was reading 150-182. She says she is feeling better today.

## 2018-06-12 NOTE — Telephone Encounter (Signed)
Needs to see an APP in the office early this coming week

## 2018-06-25 ENCOUNTER — Other Ambulatory Visit: Payer: Self-pay

## 2018-06-25 ENCOUNTER — Encounter: Payer: Medicare Other | Admitting: Family Medicine

## 2018-06-25 NOTE — Telephone Encounter (Signed)
Follow up   per pt call  Stated pcp stated pt needs and ekg please give pt a call with and questions and to schedule and in office visit please.

## 2018-06-25 NOTE — Telephone Encounter (Signed)
Called patient, spoke with husband who advised patient was in the shower, and would call back to schedule either later today or early next week.  Per Dr.Berry he suggested to see APP. Will schedule when patient calls back.
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
## 2018-06-28 ENCOUNTER — Telehealth: Payer: Self-pay | Admitting: Cardiovascular Disease

## 2018-06-28 NOTE — Telephone Encounter (Signed)
New message:     Patient calling stating that some one was to call about a EKG.

## 2018-06-28 NOTE — Telephone Encounter (Signed)
SPOKE WITH PATIENT PER  PREVIOUS PHONE CALL    NEEDS APPT WITH EXTENDER pER DR BERRY  APPT GIVEN 06/30/18 IN OFFICE VISIT.  PATIENT AWARE NEEDS FACE COVERING ,ONLY SHE IS ABLE TO COME TO OFFICE AND ALL  COVIDQUESTION WERE NO      COVID-19 Pre-Screening Questions:  . In the past 7 to 10 days have you had a cough,  shortness of breath, headache, congestion, fever (100 or greater) body aches, chills, sore throat, or sudden loss of taste or sense of smell?NP . Have you been around anyone with known Covid 19.NO . Have you been around anyone who is awaiting Covid 19 test results in the past 7 to 10 days?NO . Have you been around anyone who has been exposed to Covid 19, or has mentioned symptoms of Covid 19 within the past 7 to 10 days?NO  If you have any concerns/questions about symptoms patients report during screening (either on the phone or at threshold). Contact the provider seeing the patient or DOD for further guidance.  If neither are available contact a member of the leadership team.

## 2018-06-29 NOTE — Progress Notes (Signed)
Cardiology Office Note   Date:  06/29/2018   ID:  Janet Kramer, DOB 14-Jun-1945, MRN [id number]  PCP:  Rutherford Guys, MD  Cardiologist:  Dr.Berry  CC: Palpitations    History of Present Illness: Janet Kramer is a 73 y.o. female who presents for follow up, after being seen initially by Dr.Berry on 01/19/2018 for left arm pain, with history of hyperlipidemia. Dr.Berry felt the let arm pain was musculoskeletal and radicular not cardiac in etiology. No cardiac testing was planned. She was also counseled on low cholesterol diet.   She called our office on 06/11/2018 reporting that while sitting on her porch she felt her heart racing, felt dizzy, and had visual disturbances. She felt "weird" with symptoms lasting several hours. BP ranged from 115/93-147/103. Due to these symptoms she was recommended to be seen in the office for evaluation.   She states that she feels her HR go up sporadically but very transient. On the day that she felt HR go up to high rate, she states it lasted for several minutes with the associated hypertension. She states the following day everything was normal. She drinks at least 4 cups of coffee a day. She is active working in her yard and is the [employer], which keeps her busy.    Past Medical History:  Diagnosis Date  . Allergy   . Anemia    as a child  . Arthritis   . Asthma   . Bronchitis    has recurrent bronchitis  . Cataract   . Glaucoma   . Hyperlipidemia     Past Surgical History:  Procedure Laterality Date  . COLONOSCOPY    . [place]   right  . FRACTURE SURGERY  2001   ankle - right     . ROTATOR CUFF REPAIR    . TONSILLECTOMY  1949     Current Outpatient Medications  Medication Sig Dispense Refill  . aspirin 325 MG tablet Take 325 mg by mouth daily.    . calcium citrate-vitamin D (CITRACAL+D) 315-200 MG-UNIT tablet Take 1 tablet by mouth daily.    Marland Kitchen latanoprost (XALATAN) 0.005 % ophthalmic solution Place 1 drop into  both eyes at bedtime.     Current Facility-Administered Medications  Medication Dose Route Frequency Provider Last Rate Last Dose  . 0.9 %  sodium chloride infusion  500 mL Intravenous Once Ladene Artist, MD        Allergies:   Codeine and Oxycontin [oxycodone hcl]    Social History:  The patient  reports that she has never smoked. She has never used smokeless tobacco. She reports current alcohol use of about 2.0 standard drinks of alcohol per week. She reports that she does not use drugs.   Family History:  The patient's family history includes Cancer in her father and mother; Colon cancer (age of onset: 55) in her mother; Colon polyps in her daughter; Diabetes in her paternal grandmother; Heart disease in her maternal grandfather; Stroke in her maternal grandmother.    ROS: All other systems are reviewed and negative. Unless otherwise mentioned in H&P    PHYSICAL EXAM: VS:  There were no vitals taken for this visit. , BMI There is no height or weight on file to calculate BMI. GEN: Well nourished, well developed, in no acute distress HEENT: normal Neck: no JVD, carotid bruits, or masses Cardiac: RRR; no murmurs, rubs, or gallops,no edema  Respiratory:  Clear to auscultation bilaterally,  normal work of breathing GI: soft, nontender, nondistended, + BS MS: no deformity or atrophy Skin: warm and dry, no rash Neuro:  Strength and sensation are intact Psych: euthymic mood, full affect   EKG:  NSR with incomplete RBBB, rate of 89 bpm, with left axis deviation.  RBBB is not new.   Recent Labs: 01/14/2018: Hemoglobin 14.3    Lipid Panel    Component Value Date/Time   CHOL 269 (H) 01/16/2016 1631   TRIG 141 01/16/2016 1631   HDL 77 01/16/2016 1631   CHOLHDL 3.5 01/16/2016 1631   CHOLHDL 3.6 07/22/2013 1106   VLDL 24 07/22/2013 1106   LDLCALC 164 (H) 01/16/2016 1631      Wt Readings from Last 3 Encounters:  05/25/18 152 lb (68.9 kg)  03/19/18 152 lb 12.8 oz (69.3 kg)   01/19/18 152 lb (68.9 kg)    Other studies Reviewed: None  ASSESSMENT AND PLAN:  1.  Racing HR with associated hypertension: Will have a 30 day Zio cardiac monitor placed for evaluation of frequency and morphology of her rapid HR. She is given lopressor 25 mg to take PRN for episodes that last longer than 15 minutes. She will have echocardiogram completed for evaluation of LV fx and atrial size. She will also have a BMET to check for potassium status and kidney function.  Will see Dr. Gwenlyn Found in 6 weeks to discuss the results. She is to call us if she has to take the lopressor. She is to cut back on caffeine.     Current medicines are reviewed at length with the patient today.    Labs/ tests ordered today include: BMET, Echocardiogram, 30 day Zio monitor.   Phill Myron. West Pugh, ANP, [facility]   06/29/2018 7:41 AM    [facility] [place] [address] Office [phone] Fax [phone]

## 2018-06-30 ENCOUNTER — Encounter: Payer: Self-pay | Admitting: Adult Health

## 2018-06-30 ENCOUNTER — Ambulatory Visit (INDEPENDENT_AMBULATORY_CARE_PROVIDER_SITE_OTHER): Payer: Medicare HMO | Admitting: Adult Health

## 2018-06-30 ENCOUNTER — Telehealth: Payer: Self-pay | Admitting: Radiology

## 2018-06-30 ENCOUNTER — Other Ambulatory Visit: Payer: Self-pay

## 2018-06-30 VITALS — BP 126/70 | HR 89 | Temp 97.3°F | Ht 64.0 in | Wt 153.8 lb

## 2018-06-30 DIAGNOSIS — R0602 Shortness of breath: Secondary | ICD-10-CM

## 2018-06-30 DIAGNOSIS — R000 Tachycardia, unspecified: Secondary | ICD-10-CM

## 2018-06-30 DIAGNOSIS — R002 Palpitations: Secondary | ICD-10-CM

## 2018-06-30 DIAGNOSIS — Z79899 Other long term (current) drug therapy: Secondary | ICD-10-CM

## 2018-06-30 LAB — BASIC METABOLIC PANEL
BUN/Creatinine Ratio: 20 (ref 12–28)
BUN: 19 mg/dL (ref 8–27)
CO2: 24 mmol/L (ref 20–29)
Calcium: 9.6 mg/dL (ref 8.7–10.3)
Chloride: 106 mmol/L (ref 96–106)
Creatinine, Ser: 0.94 mg/dL (ref 0.57–1.00)
GFR calc Af Amer: 69 mL/min/{1.73_m2} (ref >59)
GFR calc non Af Amer: 60 mL/min/{1.73_m2} (ref >59)
Glucose: 83 mg/dL (ref 65–99)
Potassium: 4.1 mmol/L (ref 3.5–5.2)
Sodium: 143 mmol/L (ref 134–144)

## 2018-06-30 MED ORDER — METOPROLOL TARTRATE 25 MG PO TABS: 25.0000 mg | ORAL_TABLET | ORAL | 3 refills | Status: DC | PRN

## 2018-06-30 NOTE — Telephone Encounter (Signed)
Enrolled patient for a 30 Day Preventice Event Monitor to be mailed. Brief instructions were gone over with the patient and she knows to expect the monitor to arrive in 3-4 days

## 2018-06-30 NOTE — Patient Instructions (Addendum)
Medication Instructions:  TAKE METOPROLOL 25MG  AS NEEDED FOR RACING HEAR RATE If you need a refill on your cardiac medications before your next appointment, please call your pharmacy.  Labwork: BMET TODAY  HERE IN OUR OFFICE AT [facility]    Take the provided lab slips with you to the lab for your blood draw.   When you have your labs (blood work) drawn today and your tests are completely normal, you will receive your results only by MyChart Message (if you have MyChart) -OR-  A paper copy in the mail.  If you have any lab test that is abnormal or we need to change your treatment, we will call you to review these results.  Testing/Procedures: Echocardiogram - Your physician has requested that you have an echocardiogram. Echocardiography is a painless test that uses sound waves to create images of your heart. It provides your doctor with information about the size and shape of your heart and how well your heart's chambers and valves are working. This procedure takes approximately one hour. There are no restrictions for this procedure. This will be performed at our [facility] location - [address], [address].  Your physician has recommended that you wear a 30 days DAY ZIO-PATCH monitor. The Zio patch cardiac monitor continuously records heart rhythm data for up to 14 days, this is for patients being evaluated for multiple types heart rhythms. For the first 24 hours post application, please avoid getting the Zio monitor wet in the shower or by excessive sweating during exercise. After that, feel free to carry on with regular activities. Keep soaps and lotions away from the ZIO XT Patch.  This may be mailed to your -or- will be placed at our [facility] location - [address], [address].    Special Instructions: Make sure to decrease your caffeine intake  Follow-Up: You will need a follow up appointment in 6 weeks after monitor is complete.  You may see Quay Burow, MD    , Jory Sims,  DNP, AACC or one of the following Advanced Practice Providers on your designated Care Team:  Kerin Ransom, PA-C  Roby Lofts, PA-C Sande Rives, [place]    At [facility], you and your health needs are our priority.  As part of our continuing mission to provide you with exceptional heart care, we have created designated Provider Care Teams.  These Care Teams include your primary Cardiologist (physician) and Advanced Practice Providers (APPs -  Physician Assistants and Nurse Practitioners) who all work together to provide you with the care you need, when you need it.  Thank you for choosing [facility] at [facility]!!

## 2018-07-07 ENCOUNTER — Telehealth (HOSPITAL_COMMUNITY): Payer: Self-pay | Admitting: *Deleted

## 2018-07-07 NOTE — Telephone Encounter (Signed)

## 2018-07-08 ENCOUNTER — Ambulatory Visit (HOSPITAL_COMMUNITY): Payer: Medicare HMO | Attending: Cardiology

## 2018-07-08 ENCOUNTER — Other Ambulatory Visit: Payer: Self-pay

## 2018-07-08 DIAGNOSIS — R002 Palpitations: Secondary | ICD-10-CM

## 2018-07-08 DIAGNOSIS — R0602 Shortness of breath: Secondary | ICD-10-CM

## 2018-07-08 DIAGNOSIS — R000 Tachycardia, unspecified: Secondary | ICD-10-CM | POA: Diagnosis not present

## 2018-07-09 ENCOUNTER — Encounter (INDEPENDENT_AMBULATORY_CARE_PROVIDER_SITE_OTHER): Payer: Medicare HMO

## 2018-07-09 DIAGNOSIS — R002 Palpitations: Secondary | ICD-10-CM

## 2018-07-09 DIAGNOSIS — R000 Tachycardia, unspecified: Secondary | ICD-10-CM | POA: Diagnosis not present

## 2018-07-15 NOTE — Progress Notes (Signed)
This encounter was created in error - please disregard.

## 2018-08-12 ENCOUNTER — Other Ambulatory Visit: Payer: Self-pay

## 2018-08-24 ENCOUNTER — Ambulatory Visit (INDEPENDENT_AMBULATORY_CARE_PROVIDER_SITE_OTHER): Payer: Medicare HMO | Admitting: Cardiovascular Disease

## 2018-08-24 ENCOUNTER — Encounter: Payer: Self-pay | Admitting: Cardiovascular Disease

## 2018-08-24 ENCOUNTER — Other Ambulatory Visit: Payer: Self-pay

## 2018-08-24 DIAGNOSIS — M79602 Pain in left arm: Secondary | ICD-10-CM

## 2018-08-24 DIAGNOSIS — E782 Mixed hyperlipidemia: Secondary | ICD-10-CM

## 2018-08-24 DIAGNOSIS — R002 Palpitations: Secondary | ICD-10-CM | POA: Diagnosis not present

## 2018-08-24 NOTE — Patient Instructions (Signed)
Medication Instructions:  Your physician recommends that you continue on your current medications as directed. Please refer to the Current Medication list given to you today.  If you need a refill on your cardiac medications before your next appointment, please call your pharmacy.   Lab work: NONE If you have labs (blood work) drawn today and your tests are completely normal, you will receive your results only by: . MyChart Message (if you have MyChart) OR . A paper copy in the mail If you have any lab test that is abnormal or we need to change your treatment, we will call you to review the results.  Testing/Procedures: NONE  Follow-Up: At [facility], you and your health needs are our priority.  As part of our continuing mission to provide you with exceptional heart care, we have created designated Provider Care Teams.  These Care Teams include your primary Cardiologist (physician) and Advanced Practice Providers (APPs -  Physician Assistants and Nurse Practitioners) who all work together to provide you with the care you need, when you need it. . You will need a follow up appointment AS NEEDED. You may see Dr. Berry or one of the following Advanced Practice Providers on your designated Care Team:   . Luke Kilroy, PA-C . Hao Meng, PA-C . Angela Duke, PA-C . Kathryn Lawrence, DNP . Rhonda Barrett, PA-C . Krista Kroeger, PA-C    

## 2018-08-24 NOTE — Progress Notes (Signed)
08/24/2018 Janet Kramer   May 22, 1945  [id number]  Primary Physician Rutherford Guys, MD Primary Cardiologist: Lorretta Harp MD [name], [name]  HPI:  Janet Kramer is a 73 y.o. 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  She was referred by Dr. Nyoka Cowden for cardiovascular valuation because of left arm pain.  I last saw her in the office 01/19/2018 Her husband [name] is also a patient of mine.  She has no cardiac risk factors except for mild hyperlipidemia.  She is never had a heart attack or stroke.  She is had left arm pain for last 2 weeks which is radicular in nature.  There are no cardiac symptoms associated with this.  Since I saw her last.  Event monitor was essentially normal as was a 2D echo.  She is had no further episodes of palpitations.  Her left arm pain has resolved with using a flat pillow.  She denies chest pain or shortness of breath.   Current Meds  Medication Sig  . aspirin 325 MG tablet Take 325 mg by mouth daily.  . calcium citrate-vitamin D (CITRACAL+D) 315-200 MG-UNIT tablet Take 1 tablet by mouth daily.  Marland Kitchen latanoprost (XALATAN) 0.005 % ophthalmic solution Place 1 drop into both eyes at bedtime.  . metoprolol tartrate (LOPRESSOR) 25 MG tablet Take 1 tablet (25 mg total) by mouth as needed (racing heartrate).   Current Facility-Administered Medications for the 08/24/18 encounter (Office Visit) with Lorretta Harp, MD  Medication  . 0.9 %  sodium chloride infusion     Allergies  Allergen Reactions  . Codeine     Overly sleepy  . Oxycontin [Oxycodone Hcl] Nausea And Vomiting    Social History   Socioeconomic History  . Marital status: Married    Spouse name: Not on file  . Number of children: 2  . Years of education: Not on file  . Highest education level: Not on file  Occupational History  . Occupation: Merchant navy officer: [employer]  Social Needs  . Financial resource strain: Not on file  . Food insecurity    Worry: Not on file    Inability: Not on file  . Transportation needs    Medical: Not on file    Non-medical: Not on file  Tobacco Use  . Smoking status: Never Smoker  . Smokeless tobacco: Never Used  Substance and Sexual Activity  . Alcohol use: Yes    Alcohol/week: 2.0 standard drinks    Types: 2 Glasses of wine per week    Comment: occasional  . Drug use: No  . Sexual activity: Not on file  Lifestyle  . Physical activity    Days per week: Not on file    Minutes per session: Not on file  . Stress: Not on file  Relationships  . Social Herbalist on phone: Not on file    Gets together: Not on file    Attends religious service: Not on file    Active member of club or organization: Not on file    Attends meetings of clubs or organizations: Not on file    Relationship status: Not on file  . Intimate partner violence    Fear of current or ex partner: Not on file    Emotionally abused: Not on file    Physically abused: Not on  file    Forced sexual activity: Not on file  Other Topics Concern  . 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      ADLs: independent with ADLs.      Advanced Directives: NO; FULL CODE; no prolonged measures; HCPOA: daughter not husband; [name].     Review of Systems: General: negative for chills, fever, night sweats or weight changes.  Cardiovascular: negative for chest pain, dyspnea on exertion, edema, orthopnea, palpitations, paroxysmal nocturnal dyspnea or shortness of breath Dermatological: negative for rash Respiratory: negative  for cough or wheezing Urologic: negative for hematuria Abdominal: negative for nausea, vomiting, diarrhea, bright red blood per rectum, melena, or hematemesis Neurologic: negative for visual changes, syncope, or dizziness All other systems reviewed and are otherwise negative except as noted above.    Blood pressure 126/80, pulse 76, temperature (!) 97 F (36.1 C), height 5\' 4"  (1.626 m), weight 152 lb (68.9 kg).  General appearance: alert Neck: no adenopathy, no carotid bruit, no JVD, supple, symmetrical, trachea midline and thyroid not enlarged, symmetric, no tenderness/mass/nodules Lungs: clear to auscultation bilaterally Heart: regular rate and rhythm, S1, S2 normal, no murmur, click, rub or gallop  EKG not performed today   ASSESSMENT AND PLAN:   Left arm pain History of left arm pain thought to be radicular improved with changing her position while sleeping (flat pillow).  No further work-up required  Hyperlipidemia History of hyperlipidemia lipid profile performed by PCP 01/16/2016 revealing total cholesterol 269, LDL of 164 and HDL of 77.  This is followed by her PCP.  Given her other positive risk factors dietary modification may be the best option although low-dose statin therapy should also be considered.  Will defer to her PCP for further evaluation and treatment  Palpitations Ms. Mcmullan had 2 episodes of palpitations that occurred prior to her 30-day event monitor and none since.  By description sounds like PSVT.  Nothing was captured on her 30-day event monitor and she is had no recurrent symptoms.  Should she have recurrent symptoms that are more frequent and are severe she would be a candidate for loop recorder.  No further work-up is necessary at this time.  Of note, 2D echo was normal as well.      Lorretta Harp MD FACP,FACC,FAHA, [facility] 08/24/2018 8:49 AM

## 2018-08-24 NOTE — Assessment & Plan Note (Signed)
Ms. Janet Kramer had 2 episodes of palpitations that occurred prior to her 30-day event monitor and none since.  By description sounds like PSVT.  Nothing was captured on her 30-day event monitor and she is had no recurrent symptoms.  Should she have recurrent symptoms that are more frequent and are severe she would be a candidate for loop recorder.  No further work-up is necessary at this time.  Of note, 2D echo was normal as well.

## 2018-08-24 NOTE — Assessment & Plan Note (Signed)
History of left arm pain thought to be radicular improved with changing her position while sleeping (flat pillow).  No further work-up required

## 2018-08-24 NOTE — Assessment & Plan Note (Signed)
History of hyperlipidemia lipid profile performed by PCP 01/16/2016 revealing total cholesterol 269, LDL of 164 and HDL of 77.  This is followed by her PCP.  Given her other positive risk factors dietary modification may be the best option although low-dose statin therapy should also be considered.  Will defer to her PCP for further evaluation and treatment

## 2018-09-24 ENCOUNTER — Telehealth: Payer: Self-pay | Admitting: *Deleted

## 2018-09-24 NOTE — Telephone Encounter (Signed)
Schedule AWV.  

## 2019-03-29 ENCOUNTER — Encounter: Payer: Self-pay | Admitting: Family Medicine

## 2019-03-29 ENCOUNTER — Ambulatory Visit (INDEPENDENT_AMBULATORY_CARE_PROVIDER_SITE_OTHER): Payer: Medicare HMO | Admitting: Family Medicine

## 2019-03-29 ENCOUNTER — Other Ambulatory Visit: Payer: Self-pay

## 2019-03-29 VITALS — BP 117/76 | HR 87 | Temp 97.8°F | Resp 15 | Ht 64.0 in | Wt 149.8 lb

## 2019-03-29 DIAGNOSIS — N941 Unspecified dyspareunia: Secondary | ICD-10-CM

## 2019-03-29 DIAGNOSIS — Z1231 Encounter for screening mammogram for malignant neoplasm of breast: Secondary | ICD-10-CM

## 2019-03-29 DIAGNOSIS — L989 Disorder of the skin and subcutaneous tissue, unspecified: Secondary | ICD-10-CM | POA: Diagnosis not present

## 2019-03-29 NOTE — Patient Instructions (Signed)
° ° ° °  If you have lab work done today you will be contacted with your lab results within the next 2 weeks.  If you have not heard from us then please contact us. The fastest way to get your results is to register for My Chart. ° ° °IF you received an x-ray today, you will receive an invoice from [facility]. Please contact [facility] at [phone] with questions or concerns regarding your invoice.  ° °IF you received labwork today, you will receive an invoice from [facility]. Please contact [facility] at [phone] with questions or concerns regarding your invoice.  ° °Our billing staff will not be able to assist you with questions regarding bills from these companies. ° °You will be contacted with the lab results as soon as they are available. The fastest way to get your results is to activate your My Chart account. Instructions are located on the last page of this paperwork. If you have not heard from us regarding the results in 2 weeks, please contact this office. °  ° ° ° °

## 2019-03-29 NOTE — Progress Notes (Signed)
3/16/20211:47 PM  Janet Kramer 1945/04/12, 74 y.o., female [id number]  Chief Complaint  Patient presents with  . Referral    pt would like to see GYN for a chronic problem was referred many years ago with no results, also would like a mammogram ordered for a follow up on previously abnormal mammo and now has some tenderness pt notes there was an irregular shapped lump that was there one day then gone, referral to a Dermatologist for a skin check of a "scalling" on her nose prefers Dr Rubin Payor, Podiatrist    HPI:   Patient is a 74 y.o. female who presents today requesting several referrals  She is overall feeling really good  Gyn: in the past saw gyn for dyspareunia, treated with vaginal estrogen which did not help at all, also have mild vaginal discharge, chronic, "carpenters wood glue"  Mammogram: last mammo 2015 at [facility], Left [place] benign, repeat screening mammo in 6 monthd advised Reports she had an oblong soft mass on right breat but it has resolved Reports normal dexa at age 61  Derm: requesting referral to Dr Bartholomew Boards in [place] She has several skin lesions that are concerning to her She has a non healing scratchy patch on her nose and a small mole on top of her lip that is becoming darker. She also has a non healing lesion along right shin  She also would like to see a podiatrist Right having callus and corns, pain in 1st MTP from OA She will call back with name  Depression screen [facility] 2/9 03/29/2019 01/16/2018 01/14/2018  Decreased Interest 0 0 0  Down, Depressed, Hopeless 0 0 0  PHQ - 2 Score 0 0 0    Fall Risk  03/29/2019 01/16/2018 01/14/2018 05/24/2016 01/16/2016  Falls in the past year? 0 0 0 No No  Follow up Falls evaluation completed - - - -     Allergies  Allergen Reactions  . Codeine     Overly sleepy  . Oxycontin [Oxycodone Hcl] Nausea And Vomiting    Prior to Admission medications   Medication Sig Start Date End Date Taking? Authorizing Provider    aspirin 325 MG tablet Take 325 mg by mouth daily.   Yes [provider]  calcium citrate-vitamin D (CITRACAL+D) 315-200 MG-UNIT tablet Take 1 tablet by mouth daily.   Yes [provider]  latanoprost (XALATAN) 0.005 % ophthalmic solution Place 1 drop into both eyes at bedtime.   Yes [provider]    Past Medical History:  Diagnosis Date  . Allergy   . Anemia    as a child  . Arthritis   . Asthma   . Bronchitis    has recurrent bronchitis  . Cataract   . Glaucoma   . Hyperlipidemia     Past Surgical History:  Procedure Laterality Date  . COLONOSCOPY    . [place]   right  . FRACTURE SURGERY  2001   ankle - right     . ROTATOR CUFF REPAIR    . TONSILLECTOMY  1949    Social History   Tobacco Use  . Smoking status: Never Smoker  . Smokeless tobacco: Never Used  Substance Use Topics  . Alcohol use: Yes    Alcohol/week: 2.0 standard drinks    Types: 2 Glasses of wine per week    Comment: occasional    Family History  Problem Relation Age of Onset  . Cancer Mother  colon  . Colon cancer Mother 51  . Cancer Father        lung  . Diabetes Paternal Grandmother   . Stroke Maternal Grandmother   . Heart disease Maternal Grandfather   . Colon polyps Daughter   . Stomach cancer Neg Hx   . Esophageal cancer Neg Hx   . Rectal cancer Neg Hx     ROS Per hpi  OBJECTIVE:  Today's Vitals   03/29/19 1339  BP: 117/76  Pulse: 87  Resp: 15  Temp: 97.8 F (36.6 C)  TempSrc: Temporal  SpO2: 97%  Weight: 149 lb 12.8 oz (67.9 kg)  Height: 5\' 4"  (1.626 m)   Body mass index is 25.71 kg/m.   Physical Exam Vitals and nursing note reviewed.  Constitutional:      Appearance: She is well-developed.  HENT:     Head: Normocephalic and atraumatic.  Eyes:     General: No scleral icterus.    Conjunctiva/sclera: Conjunctivae normal.     Pupils: Pupils are equal, round, and reactive to light.  Pulmonary:     Effort:  Pulmonary effort is normal.  Musculoskeletal:     Cervical back: Neck supple.  Skin:    General: Skin is warm and dry.  Neurological:     Mental Status: She is alert and oriented to person, place, and time.     No results found for this or any previous visit (from the past 24 hour(s)).  No results found.   ASSESSMENT and PLAN  1. Dyspareunia in female - Ambulatory referral to Obstetrics / Gynecology  2. Visit for screening mammogram - MM DIGITAL SCREENING BILATERAL; Future  3. Skin lesions - Ambulatory referral to Dermatology  No follow-ups on file.    Rutherford Guys, MD Primary Care at [address] [place], [place] [address] Ph.  [phone] Fax [phone]

## 2019-04-20 DIAGNOSIS — H401131 Primary open-angle glaucoma, bilateral, mild stage: Secondary | ICD-10-CM | POA: Diagnosis not present

## 2019-04-20 DIAGNOSIS — Z961 Presence of intraocular lens: Secondary | ICD-10-CM | POA: Diagnosis not present

## 2019-04-20 DIAGNOSIS — H20011 Primary iridocyclitis, right eye: Secondary | ICD-10-CM | POA: Diagnosis not present

## 2019-04-20 DIAGNOSIS — H353131 Nonexudative age-related macular degeneration, bilateral, early dry stage: Secondary | ICD-10-CM | POA: Diagnosis not present

## 2019-04-25 DIAGNOSIS — N952 Postmenopausal atrophic vaginitis: Secondary | ICD-10-CM | POA: Diagnosis not present

## 2019-04-25 DIAGNOSIS — N898 Other specified noninflammatory disorders of vagina: Secondary | ICD-10-CM | POA: Diagnosis not present

## 2019-05-05 ENCOUNTER — Encounter: Payer: Self-pay | Admitting: Emergency Medicine

## 2019-05-05 ENCOUNTER — Ambulatory Visit (INDEPENDENT_AMBULATORY_CARE_PROVIDER_SITE_OTHER): Payer: Medicare HMO | Admitting: Emergency Medicine

## 2019-05-05 ENCOUNTER — Other Ambulatory Visit: Payer: Self-pay

## 2019-05-05 ENCOUNTER — Ambulatory Visit (INDEPENDENT_AMBULATORY_CARE_PROVIDER_SITE_OTHER): Payer: Medicare HMO

## 2019-05-05 VITALS — BP 116/73 | HR 89 | Temp 98.4°F | Resp 16 | Ht 64.0 in | Wt 148.0 lb

## 2019-05-05 DIAGNOSIS — M79671 Pain in right foot: Secondary | ICD-10-CM

## 2019-05-05 DIAGNOSIS — M19071 Primary osteoarthritis, right ankle and foot: Secondary | ICD-10-CM | POA: Diagnosis not present

## 2019-05-05 NOTE — Patient Instructions (Addendum)
     If you have lab work done today you will be contacted with your lab results within the next 2 weeks.  If you have not heard from us then please contact us. The fastest way to get your results is to register for My Chart.   IF you received an x-ray today, you will receive an invoice from [facility]. Please contact [facility] at [phone] with questions or concerns regarding your invoice.   IF you received labwork today, you will receive an invoice from [facility]. Please contact [facility] at [phone] with questions or concerns regarding your invoice.   Our billing staff will not be able to assist you with questions regarding bills from these companies.  You will be contacted with the lab results as soon as they are available. The fastest way to get your results is to activate your My Chart account. Instructions are located on the last page of this paperwork. If you have not heard from us regarding the results in 2 weeks, please contact this office.      Foot Pain Many things can cause foot pain. Some common causes are:  An injury.  A sprain.  Arthritis.  Blisters.  Bunions. Follow these instructions at home: Managing pain, stiffness, and swelling If directed, put ice on the painful area:  Put ice in a plastic bag.  Place a towel between your skin and the bag.  Leave the ice on for 20 minutes, 2-3 times a day.  Activity  Do not stand or walk for long periods.  Return to your normal activities as told by your health care provider. Ask your health care provider what activities are safe for you.  Do stretches to relieve foot pain and stiffness as told by your health care provider.  Do not lift anything that is heavier than 10 lb (4.5 kg), or the limit that you are told, until your health care provider says that it is safe. Lifting a lot of weight can put added pressure on your feet. Lifestyle  Wear comfortable, supportive shoes that fit you  well. Do not wear high heels.  Keep your feet clean and dry. General instructions  Take over-the-counter and prescription medicines only as told by your health care provider.  Rub your foot gently.  Pay attention to any changes in your symptoms.  Keep all follow-up visits as told by your health care provider. This is important. Contact a health care provider if:  Your pain does not get better after a few days of self-care.  Your pain gets worse.  You cannot stand on your foot. Get help right away if:  Your foot is numb or tingling.  Your foot or toes are swollen.  Your foot or toes turn white or blue.  You have warmth and redness along your foot. Summary  Common causes of foot pain are injury, sprain, arthritis, blisters or bunions.  Ice, medicines, and comfortable shoes may help foot pain.  Contact your health care provider if your pain does not get better after a few days of self-care. This information is not intended to replace advice given to you by your health care provider. Make sure you discuss any questions you have with your health care provider. Document Revised: 10/15/2017 Document Reviewed: 10/15/2017 Elsevier Patient Education  2020 Elsevier Inc.  

## 2019-05-05 NOTE — Progress Notes (Signed)
Janet Kramer 73 y.o.   No chief complaint on file.   HISTORY OF PRESENT ILLNESS: This is a 74 y.o. female complaining of pain to her right foot for several days.  Denies trauma.  No history of gout.  Denies redness swelling or any other significant symptoms.  HPI   Prior to Admission medications   Medication Sig Start Date End Date Taking? Authorizing Provider  aspirin 325 MG tablet Take 325 mg by mouth daily.   Yes [provider]  calcium citrate-vitamin D (CITRACAL+D) 315-200 MG-UNIT tablet Take 1 tablet by mouth daily.   Yes [provider]  latanoprost (XALATAN) 0.005 % ophthalmic solution Place 1 drop into both eyes at bedtime.    [provider]    Allergies  Allergen Reactions  . Codeine     Overly sleepy  . Oxycontin [Oxycodone Hcl] Nausea And Vomiting    Patient Active Problem List   Diagnosis Date Noted  . Hyperlipidemia 01/19/2018    Past Medical History:  Diagnosis Date  . Allergy   . Anemia    as a child  . Arthritis   . Asthma   . Bronchitis    has recurrent bronchitis  . Cataract   . Glaucoma   . Hyperlipidemia     Past Surgical History:  Procedure Laterality Date  . COLONOSCOPY    . [place]   right  . FRACTURE SURGERY  2001   ankle - right     . ROTATOR CUFF REPAIR    . TONSILLECTOMY  1949    Social History   Socioeconomic History  . Marital status: Married    Spouse name: Not on file  . Number of children: 2  . Years of education: Not on file  . Highest education level: Not on file  Occupational History  . Occupation: Merchant navy officer: [employer]  Tobacco Use  . Smoking status: Never Smoker  . Smokeless tobacco: Never Used  Substance and Sexual Activity  . Alcohol use: Yes    Alcohol/week: 2.0 standard drinks    Types: 2 Glasses of wine per week    Comment: occasional  . Drug use: No  . Sexual activity: Not on file  Other Topics Concern  . 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      ADLs: independent with ADLs.      Advanced Directives: NO; FULL CODE; no prolonged measures; HCPOA: daughter not husband; [name].   Social Determinants of Health   Financial Resource Strain:   . Difficulty of Paying Living Expenses:   Food Insecurity:   . Worried About Charity fundraiser in the Last Year:   . Arboriculturist in the Last Year:   Transportation Needs:   . Film/video editor (Medical):   Marland Kitchen Lack of Transportation (Non-Medical):   Physical Activity:   . Days of Exercise per Week:   . Minutes of Exercise per Session:   Stress:   . Feeling of Stress :  Social Connections:   . Frequency of Communication with Friends and Family:   . Frequency of Social Gatherings with Friends and Family:   . Attends Religious Services:   . Active Member of Clubs or Organizations:   . Attends Archivist Meetings:   Marland Kitchen Marital Status:   Intimate Partner Violence:   . Fear of Current or Ex-Partner:   . Emotionally Abused:   Marland Kitchen Physically Abused:   . Sexually Abused:     Family History  Problem Relation Age of Onset  . Cancer Mother        colon  . Colon cancer Mother 52  . Cancer Father        lung  . Diabetes Paternal Grandmother   . Stroke Maternal Grandmother   . Heart disease Maternal Grandfather   . Colon polyps Daughter   . Stomach cancer Neg Hx   . Esophageal cancer Neg Hx   . Rectal cancer Neg Hx      Review of Systems  Constitutional: Negative.  Negative for chills and fever.  HENT: Negative.  Negative for congestion and sore throat.   Respiratory: Negative.  Negative for cough and shortness of breath.     Cardiovascular: Negative.  Negative for chest pain.  Gastrointestinal: Negative for abdominal pain, nausea and vomiting.  Musculoskeletal: Negative for back pain and neck pain.  Skin: Negative.  Negative for rash.  Neurological: Negative for dizziness and headaches.  All other systems reviewed and are negative.  Today's Vitals   05/05/19 1413  BP: 116/73  Pulse: 89  Resp: 16  Temp: 98.4 F (36.9 C)  TempSrc: Temporal  SpO2: 96%  Weight: 148 lb (67.1 kg)  Height: 5\' 4"  (1.626 m)   Body mass index is 25.4 kg/m.   Physical Exam Constitutional:      Appearance: Normal appearance.  HENT:     Head: Normocephalic.  Eyes:     Extraocular Movements: Extraocular movements intact.     Pupils: Pupils are equal, round, and reactive to light.  Cardiovascular:     Rate and Rhythm: Normal rate.  Pulmonary:     Effort: Pulmonary effort is normal.  Musculoskeletal:     Cervical back: Normal range of motion.     Comments: Right foot: No erythema or ecchymosis.  No swelling.  Warm to touch.  Good peripheral pulses and excellent capillary refill.  Mild tenderness to plantar surface of proximal second and third toes areas.  Full range of motion.  Skin:    General: Skin is warm and dry.     Capillary Refill: Capillary refill takes less than 2 seconds.  Neurological:     General: No focal deficit present.     Mental Status: She is alert and oriented to person, place, and time.  Psychiatric:        Mood and Affect: Mood normal.        Behavior: Behavior normal.    DG Foot Complete Right  Result Date: 05/05/2019 CLINICAL DATA:  Right foot pain. EXAM: RIGHT FOOT COMPLETE - 3+ VIEW COMPARISON:  None. FINDINGS: No acute fracture or dislocation. Moderate to severe first MTP joint space narrowing with marginal osteophytes. Remaining joint spaces are preserved. Bone mineralization is normal. Soft tissues are unremarkable. IMPRESSION: 1. Moderate to severe first MTP joint osteoarthritis.  Electronically Signed   By: Titus Dubin M.D.   On: 05/05/2019 14:59     ASSESSMENT & PLAN: Diagnoses and all orders for this visit:  Right foot  pain -     DG Foot Complete Right    Patient Instructions       If you have lab work done today you will be contacted with your lab results within the next 2 weeks.  If you have not heard from [place] then please contact us. The fastest way to get your results is to register for My Chart.   IF you received an x-ray today, you will receive an invoice from [facility]. Please contact [facility] at [phone] with questions or concerns regarding your invoice.   IF you received labwork today, you will receive an invoice from [facility]. Please contact [facility] at [phone] with questions or concerns regarding your invoice.   Our billing staff will not be able to assist you with questions regarding bills from these companies.  You will be contacted with the lab results as soon as they are available. The fastest way to get your results is to activate your My Chart account. Instructions are located on the last page of this paperwork. If you have not heard from [place] regarding the results in 2 weeks, please contact this office.     Foot Pain Many things can cause foot pain. Some common causes are:  An injury.  A sprain.  Arthritis.  Blisters.  Bunions. Follow these instructions at home: Managing pain, stiffness, and swelling If directed, put ice on the painful area:  Put ice in a plastic bag.  Place a towel between your skin and the bag.  Leave the ice on for 20 minutes, 2-3 times a day.  Activity  Do not stand or walk for long periods.  Return to your normal activities as told by your health care provider. Ask your health care provider what activities are safe for you.  Do stretches to relieve foot pain and stiffness as told by your health care provider.  Do not lift anything that is heavier than 10 lb  (4.5 kg), or the limit that you are told, until your health care provider says that it is safe. Lifting a lot of weight can put added pressure on your feet. Lifestyle  Wear comfortable, supportive shoes that fit you well. Do not wear high heels.  Keep your feet clean and dry. General instructions  Take over-the-counter and prescription medicines only as told by your health care provider.  Rub your foot gently.  Pay attention to any changes in your symptoms.  Keep all follow-up visits as told by your health care provider. This is important. Contact a health care provider if:  Your pain does not get better after a few days of self-care.  Your pain gets worse.  You cannot stand on your foot. Get help right away if:  Your foot is numb or tingling.  Your foot or toes are swollen.  Your foot or toes turn white or blue.  You have warmth and redness along your foot. Summary  Common causes of foot pain are injury, sprain, arthritis, blisters or bunions.  Ice, medicines, and comfortable shoes may help foot pain.  Contact your health care provider if your pain does not get better after a few days of self-care. This information is not intended to replace advice given to you by your health care provider. Make sure you discuss any questions you have with your health care provider. Document Revised: 10/15/2017 Document Reviewed: 10/15/2017 Elsevier Patient Education  2020 Elsevier Inc.      Agustina Caroli, MD [facility]

## 2019-05-18 ENCOUNTER — Telehealth: Payer: Self-pay | Admitting: Family Medicine

## 2019-05-18 DIAGNOSIS — M79671 Pain in right foot: Secondary | ICD-10-CM

## 2019-05-18 NOTE — Telephone Encounter (Signed)
It is appropriate.  Please send the referral thanks.

## 2019-05-18 NOTE — Telephone Encounter (Signed)
Copied from [place] [phone]. Topic: Referral - Request for Referral >> May 05, 2019  9:01 AM Celene Kras wrote: Has patient seen PCP for this complaint? Yes.   *If NO, is insurance requiring patient see PCP for this issue before PCP can refer them? Referral for which specialty: Podiatrist Preferred provider/office: Dr. Jacqualyn Posey [facility]  Reason for referral: right foot swollen to the point where she cant walk

## 2019-05-18 NOTE — Telephone Encounter (Signed)
Patient was seen for R foot pain 05/05/19, she is requesting a podiatry referral to [facility] / Dr Jacqualyn Posey, would this be appropriate. Please advice

## 2019-05-18 NOTE — Addendum Note (Signed)
Addended by: Anastasio Auerbach R on: 05/18/2019 04:40 PM   Modules accepted: Orders

## 2019-06-08 DIAGNOSIS — H401131 Primary open-angle glaucoma, bilateral, mild stage: Secondary | ICD-10-CM | POA: Diagnosis not present

## 2019-06-08 DIAGNOSIS — Z961 Presence of intraocular lens: Secondary | ICD-10-CM | POA: Diagnosis not present

## 2019-06-08 DIAGNOSIS — D3141 Benign neoplasm of right ciliary body: Secondary | ICD-10-CM | POA: Diagnosis not present

## 2019-06-16 DIAGNOSIS — C44519 Basal cell carcinoma of skin of other part of trunk: Secondary | ICD-10-CM | POA: Diagnosis not present

## 2019-06-16 DIAGNOSIS — Z7189 Other specified counseling: Secondary | ICD-10-CM | POA: Diagnosis not present

## 2019-06-16 DIAGNOSIS — D485 Neoplasm of uncertain behavior of skin: Secondary | ICD-10-CM | POA: Diagnosis not present

## 2019-06-16 DIAGNOSIS — L570 Actinic keratosis: Secondary | ICD-10-CM | POA: Diagnosis not present

## 2019-06-16 DIAGNOSIS — L814 Other melanin hyperpigmentation: Secondary | ICD-10-CM | POA: Diagnosis not present

## 2019-06-16 DIAGNOSIS — D229 Melanocytic nevi, unspecified: Secondary | ICD-10-CM | POA: Diagnosis not present

## 2019-06-20 ENCOUNTER — Ambulatory Visit: Payer: Medicare HMO | Admitting: Podiatry

## 2019-06-20 ENCOUNTER — Other Ambulatory Visit: Payer: Self-pay

## 2019-06-20 ENCOUNTER — Ambulatory Visit (INDEPENDENT_AMBULATORY_CARE_PROVIDER_SITE_OTHER): Payer: Medicare HMO

## 2019-06-20 VITALS — Temp 96.6°F

## 2019-06-20 DIAGNOSIS — M2021 Hallux rigidus, right foot: Secondary | ICD-10-CM

## 2019-06-20 DIAGNOSIS — M205X1 Other deformities of toe(s) (acquired), right foot: Secondary | ICD-10-CM | POA: Diagnosis not present

## 2019-06-20 DIAGNOSIS — M779 Enthesopathy, unspecified: Secondary | ICD-10-CM | POA: Diagnosis not present

## 2019-06-20 DIAGNOSIS — L84 Corns and callosities: Secondary | ICD-10-CM

## 2019-06-20 DIAGNOSIS — M2041 Other hammer toe(s) (acquired), right foot: Secondary | ICD-10-CM | POA: Diagnosis not present

## 2019-06-20 NOTE — Progress Notes (Signed)
Subjective:   Patient ID: Janet Kramer, female   DOB: 74 y.o.   MRN: [id number]   HPI 74 year old female presents the office today for concerns of pain and swelling to her right foot.  She states that she has had issues with her right big toe for quite some time however recently she started to have discomfort point to the second MPJ plantarly.  She preferred to do injections in the right foot one was helpful the other was not.  She is also starting to get some swelling and pain to the second MPJ more recently.  Denies any recent injury or trauma.  She has a corn on the right fifth toe which has been ongoing issue.  No other concerns today.  Review of Systems  All other systems reviewed and are negative.  Past Medical History:  Diagnosis Date  . Allergy   . Anemia    as a child  . Arthritis   . Asthma   . Bronchitis    has recurrent bronchitis  . Cataract   . Glaucoma   . Hyperlipidemia     Past Surgical History:  Procedure Laterality Date  . COLONOSCOPY    . [place]   right  . FRACTURE SURGERY  2001   ankle - right     . ROTATOR CUFF REPAIR    . TONSILLECTOMY  1949     Current Outpatient Medications:  .  aspirin 325 MG tablet, Take 325 mg by mouth daily., Disp: , Rfl:  .  calcium citrate-vitamin D (CITRACAL+D) 315-200 MG-UNIT tablet, Take 1 tablet by mouth daily., Disp: , Rfl:  .  Estradiol 10 MCG TABS vaginal tablet, , Disp: , Rfl:  .  latanoprost (XALATAN) 0.005 % ophthalmic solution, Place 1 drop into both eyes at bedtime., Disp: , Rfl:   Current Facility-Administered Medications:  .  0.9 %  sodium chloride infusion, 500 mL, Intravenous, Once, Ladene Artist, MD  Allergies  Allergen Reactions  . Codeine     Overly sleepy  . Oxycontin [Oxycodone Hcl] Nausea And Vomiting         Objective:  Physical Exam  General: AAO x3, NAD  Dermatological: Hyperkeratotic lesion dorsal lateral right fifth digit.  There are no open sores, no other preulcerative  lesions, no rash or signs of infection present.  Vascular: Dorsalis Pedis artery and Posterior Tibial artery pedal pulses are 2/4 bilateral with immedate capillary fill time.[place] Scrape is no pain with calf compression, swelling, warmth, erythema.   Neruologic: Grossly intact via light touch bilateral.   Musculoskeletal: Decreased range of motion of first MPJ.  Tenderness at the base of the second digit/MPJ.  No pain with MPJ range of motion.  Lachman test is negative of the second MPJ.  Adductovarus present fifth toe resulted in a hyperkeratotic lesion without any underlying ulceration drainage or any signs of infection.  Muscular strength 5/5 in all groups tested bilateral.  Gait: Unassisted, Nonantalgic.       Assessment:   74 year old female hallux rigidus first MPJ, second digit capsulitis with adductovarus of the fifth digit    Plan:  -Treatment options discussed including all alternatives, risks, and complications -Etiology of symptoms were discussed -X-rays were obtained and reviewed with the patient.  Arthritic changes present the first MPJ.  There is no evidence of acute fracture identified today. -I debrided the hyperkeratotic tissue of the fifth toe with any complications or bleeding. -Discussed steroid injection but she wants to hold off as  it was not helpful last time.  Discussed wearing stiffer soled shoes, Voltaren gel.  Also discussed surgical intervention.  Discussed with her first MPJ arthroplasty with implant versus arthrodesis.  Likely will proceed with implant arthroplasty.  Due to other issues she has going on right now she will hold off on surgery and she will contact the office should she desire to do this.  No follow-ups on file.  Trula Slade DPM

## 2019-07-11 DIAGNOSIS — N760 Acute vaginitis: Secondary | ICD-10-CM | POA: Diagnosis not present

## 2019-08-10 DIAGNOSIS — C44519 Basal cell carcinoma of skin of other part of trunk: Secondary | ICD-10-CM | POA: Diagnosis not present

## 2019-08-15 DIAGNOSIS — N898 Other specified noninflammatory disorders of vagina: Secondary | ICD-10-CM | POA: Diagnosis not present

## 2019-08-25 ENCOUNTER — Telehealth: Payer: Self-pay | Admitting: Family Medicine

## 2019-08-25 NOTE — Telephone Encounter (Signed)
[employer] called and is needing the referral to the dermatologist sent in to [facility]. Stated it was closed because it didn't go threw to them first. Please advise.

## 2019-08-26 NOTE — Telephone Encounter (Signed)
Pt referral needs to be sent to [facility] first? I am not sure why but this pt Derm referral was closed bc it was not sent there first is it possible to use the previous order and send it to them or does it need a new order?

## 2019-09-21 DIAGNOSIS — H401131 Primary open-angle glaucoma, bilateral, mild stage: Secondary | ICD-10-CM | POA: Diagnosis not present

## 2019-09-21 DIAGNOSIS — H0289 Other specified disorders of eyelid: Secondary | ICD-10-CM | POA: Diagnosis not present

## 2019-10-11 ENCOUNTER — Telehealth: Payer: Self-pay | Admitting: Family Medicine

## 2019-10-11 NOTE — Telephone Encounter (Signed)
Retroactive referral for the dermatologist on 03/29/19 [facility], Dr Bartholomew Boards. Pt stated that her insurance was not filed for this referral.   Pt stated that she was suppose to have a referral for a mammogram.  Please advise.

## 2019-10-11 NOTE — Telephone Encounter (Signed)
Pt mammo was ordered 03/30/2019 but was never sent out to be completed

## 2019-11-01 ENCOUNTER — Telehealth: Payer: Self-pay | Admitting: Family Medicine

## 2019-11-01 NOTE — Telephone Encounter (Signed)
Pt is calling in concerning a $800 bill. The insurance company [employer] is telling her they  did not get the referral from PCP. The referral is-Sent to [facility], Dr Bartholomew Boards Medical Release #: [id number]. Please advise pt she is just needing proof that a referral was sent to his office. Her [phone].

## 2019-11-03 NOTE — Telephone Encounter (Signed)
Called and spoke to the patient and she stated they needed the order and a letter stating her referral was active at the time of her appt, faxed today to number provided by Mcarthur Rossetti [phone] call reference for [facility] is #[id number]

## 2019-11-08 DIAGNOSIS — C44712 Basal cell carcinoma of skin of right lower limb, including hip: Secondary | ICD-10-CM | POA: Diagnosis not present

## 2019-11-08 DIAGNOSIS — L814 Other melanin hyperpigmentation: Secondary | ICD-10-CM | POA: Diagnosis not present

## 2019-11-08 DIAGNOSIS — D485 Neoplasm of uncertain behavior of skin: Secondary | ICD-10-CM | POA: Diagnosis not present

## 2019-11-08 DIAGNOSIS — D1801 Hemangioma of skin and subcutaneous tissue: Secondary | ICD-10-CM | POA: Diagnosis not present

## 2019-11-08 DIAGNOSIS — Z85828 Personal history of other malignant neoplasm of skin: Secondary | ICD-10-CM | POA: Diagnosis not present

## 2019-11-08 DIAGNOSIS — C44722 Squamous cell carcinoma of skin of right lower limb, including hip: Secondary | ICD-10-CM | POA: Diagnosis not present

## 2019-11-08 DIAGNOSIS — Z7189 Other specified counseling: Secondary | ICD-10-CM | POA: Diagnosis not present

## 2019-11-17 DIAGNOSIS — N952 Postmenopausal atrophic vaginitis: Secondary | ICD-10-CM | POA: Diagnosis not present

## 2019-11-23 ENCOUNTER — Telehealth: Payer: Self-pay | Admitting: Family Medicine

## 2019-11-23 NOTE — Telephone Encounter (Signed)
Received a smooth transfer from [facility].   Janet Kramer from [place], Dr. Gerald Stabs [place]'s office called and stated that they need to a referral sent to them for insurance to pay for the pts visits that she has had with them. I have informed her that the referral as been sent back in 03/29/19. I have also informed her that it has already been faxed to [facility], however they are still not recognizing this. I have faxed over the referral that was sent back in march to [place] at [phone].  Thanks.

## 2019-11-23 NOTE — Telephone Encounter (Signed)
11/23/2019 - I RECEIVED A CALL FROM RHONDA AT [facility] [place] [facility]. PATIENT HAS BEEN SEEN THERE 3 TIMES. [employer] IS TELLING THEM THAT NO REFERRAL WAS EVER SENT. SHE IS A FORMER PATIENT OF DR. IRMA SANTIAGO. SHE WOULD LIKE TO GET AN UPDATE IF AN AUTHORIZATION WAS EVER MADE? RONDA SPOKE WITH MEI THIS MORNING BUT SHE HAS ANOTHER QUESTION. BEST PHONE [phone] ([facility]) [place]

## 2019-11-23 NOTE — Telephone Encounter (Addendum)
I have faxed the referral that was sent on 03/29/2019 to [place] with [employer].

## 2019-11-23 NOTE — Telephone Encounter (Signed)
11/23/2019 - PATIENT CALLED REGARDING THE REFERRAL TO THE DERMATOLOGIST. SHE STATES [employer] STILL HAS NOT GOTTEN THE REFERRAL AND THAT SHE HAS CALLED 4 TIMES TODAY AND CAN NOT GET ANY HELP. SHE SAID THE DERMATOLOGIST JUST WANTS TO GET PAID. SHE WANTS A CALL BACK TODAY FROM HOPEFULLY MEI SINCE SHE KNOWS ABOUT IT. I COULD NOT ATTACH THE MESSAGE  FROM EARLIER THIS MORNING FROM RONDA AT [facility] BECAUSE THE ENCOUNTER HAS BEEN CLOSED. BEST PHONE [phone]- PATIENT'S CELL   MBC

## 2019-11-24 NOTE — Telephone Encounter (Signed)
Referral is in the system, needs to be sent to [facility]

## 2019-12-23 ENCOUNTER — Telehealth: Payer: Self-pay | Admitting: Family Medicine

## 2019-12-23 NOTE — Telephone Encounter (Signed)
Patient is calling to see to see [employer] ?  Patient wants an update on this request .  The bills keep coming in and she fills its because someone dropped the ball on this referral   Please advise

## 2019-12-26 NOTE — Telephone Encounter (Signed)
Attempted to call pt to gather more information. No answer.   I have left message for pt to call back with any question and to call billing with any billing inquiries at [phone]

## 2019-12-27 ENCOUNTER — Encounter: Payer: Self-pay | Admitting: Registered Nurse

## 2019-12-27 ENCOUNTER — Ambulatory Visit (INDEPENDENT_AMBULATORY_CARE_PROVIDER_SITE_OTHER): Payer: Medicare HMO | Admitting: Registered Nurse

## 2019-12-27 ENCOUNTER — Other Ambulatory Visit: Payer: Self-pay

## 2019-12-27 VITALS — BP 124/74 | HR 83 | Temp 97.6°F | Resp 18 | Ht 64.0 in | Wt 148.2 lb

## 2019-12-27 DIAGNOSIS — L989 Disorder of the skin and subcutaneous tissue, unspecified: Secondary | ICD-10-CM | POA: Diagnosis not present

## 2019-12-27 NOTE — Patient Instructions (Signed)
° ° ° °  If you have lab work done today you will be contacted with your lab results within the next 2 weeks.  If you have not heard from us then please contact us. The fastest way to get your results is to register for My Chart. ° ° °IF you received an x-ray today, you will receive an invoice from [facility]. Please contact [facility] at [phone] with questions or concerns regarding your invoice.  ° °IF you received labwork today, you will receive an invoice from [facility]. Please contact [facility] at [phone] with questions or concerns regarding your invoice.  ° °Our billing staff will not be able to assist you with questions regarding bills from these companies. ° °You will be contacted with the lab results as soon as they are available. The fastest way to get your results is to activate your My Chart account. Instructions are located on the last page of this paperwork. If you have not heard from us regarding the results in 2 weeks, please contact this office. °  ° ° ° °

## 2019-12-28 ENCOUNTER — Encounter: Payer: Self-pay | Admitting: Registered Nurse

## 2019-12-28 NOTE — Progress Notes (Signed)
Established Patient Office Visit  Subjective:  Patient ID: Janet Kramer, female    DOB: 03-14-1945  Age: 74 y.o. MRN: [id number]  CC:  Chief Complaint  Patient presents with  . Referral    Patient states she is here for an referral for leg surgery. Per patient she has no other questions or concerns.    HPI Janet Kramer presents for referral -  Pt has hx of multiple skin lesions assessed by a few dermatologists in previous years - Most recently sought second opinion from Dr. Bartholomew Boards in [facility], who biopsied a longstanding lesion on anterior R calf and found multiple abnormalities.  Mohs procedure suggested - available with Dr. Billy Fischer at [facility] in [place]. Pt states this procedure is specialized and would not trust another provider given some negative experiences she has had in the past. Has a lot of faith in Dr. Liliana Cline recommendation.   Fortunately lesion has not had any acute changes since biopsy. It is around 1inch in diameter - mohs procedure will limit chance for infection or need for skin grafting or any reconstruction/cosmetic procedure. Pt has appt tentatively scheduled for the 6th of January  Past Medical History:  Diagnosis Date  . Allergy   . Anemia    as a child  . Arthritis   . Asthma   . Bronchitis    has recurrent bronchitis  . Cataract   . Glaucoma   . Hyperlipidemia     Past Surgical History:  Procedure Laterality Date  . COLONOSCOPY    . [place]   right  . FRACTURE SURGERY  2001   ankle - right     . ROTATOR CUFF REPAIR    . TONSILLECTOMY  1949    Family History  Problem Relation Age of Onset  . Cancer Mother        colon  . Colon cancer Mother 79  . Cancer Father        lung  . Diabetes Paternal Grandmother   . Stroke Maternal Grandmother   . Heart disease Maternal Grandfather   . Colon polyps Daughter   . Stomach cancer Neg Hx   . Esophageal cancer Neg Hx   . Rectal cancer Neg Hx     Social History    Socioeconomic History  . Marital status: Married    Spouse name: Not on file  . Number of children: 2  . Years of education: Not on file  . Highest education level: Not on file  Occupational History  . Occupation: Merchant navy officer: [employer]  Tobacco Use  . Smoking status: Never Smoker  . Smokeless tobacco: Never Used  Vaping Use  . Vaping Use: Never used  Substance and Sexual Activity  . Alcohol use: Yes    Alcohol/week: 2.0 standard drinks    Types: 2 Glasses of wine per week    Comment: occasional  . Drug use: No  . Sexual activity: Not on file  Other Topics Concern  . 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      ADLs: independent with ADLs.      Advanced Directives: NO; FULL CODE; no prolonged measures; HCPOA: daughter not husband; [name].   Social Determinants of Health   Financial Resource Strain: Not on file  Food Insecurity: Not on file  Transportation Needs: Not on file  Physical Activity: Not on file  Stress: Not on file  Social Connections: Not on file  Intimate Partner Violence: Not on file    Outpatient Medications Prior to Visit  Medication Sig Dispense Refill  . aspirin 325 MG tablet Take 325 mg by mouth daily.    . calcium citrate-vitamin D (CITRACAL+D) 315-200 MG-UNIT tablet Take 1 tablet by mouth daily.    . Estradiol 10 MCG TABS vaginal tablet     . latanoprost (XALATAN) 0.005 % ophthalmic solution Place 1 drop into both eyes at bedtime.     Facility-Administered Medications Prior to Visit  Medication Dose Route Frequency Provider Last Rate Last Admin  . 0.9 %  sodium chloride infusion  500 mL  Intravenous Once Ladene Artist, MD        Allergies  Allergen Reactions  . Codeine     Overly sleepy  . Oxycontin [Oxycodone Hcl] Nausea And Vomiting    ROS Review of Systems  Constitutional: Negative.   HENT: Negative.   Eyes: Negative.   Respiratory: Negative.   Cardiovascular: Negative.   Gastrointestinal: Negative.   Genitourinary: Negative.   Musculoskeletal: Negative.   Neurological: Negative.   Psychiatric/Behavioral: Negative.   All other systems reviewed and are negative.     Objective:    Physical Exam Constitutional:      Appearance: Normal appearance. She is normal weight.  Cardiovascular:     Rate and Rhythm: Normal rate and regular rhythm.  Pulmonary:     Effort: Pulmonary effort is normal. No respiratory distress.  Skin:    Findings: Lesion (hyperpigmented, reddish brown lesion on anterior R calf. suspicious.) present.  Neurological:     General: No focal deficit present.     Mental Status: She is alert and oriented to person, place, and time. Mental status is at baseline.  Psychiatric:        Mood and Affect: Mood normal.        Behavior: Behavior normal.        Thought Content: Thought content normal.        Judgment: Judgment normal.     BP 124/74   Pulse 83   Temp 97.6 F (36.4 C) (Temporal)   Resp 18   Ht 5\' 4"  (1.626 m)   Wt 148 lb 3.2 oz (67.2 kg)   SpO2 95%   BMI 25.44 kg/m  Wt Readings from Last 3 Encounters:  12/27/19 148 lb 3.2 oz (67.2 kg)  05/05/19 148 lb (67.1 kg)  03/29/19 149 lb 12.8 oz (67.9 kg)     There are no preventive care reminders to display for this patient.  There are no preventive care reminders to display for this patient.  Lab Results  Component Value Date   TSH 1.621 07/22/2013   Lab Results  Component Value Date   WBC 6.0 01/14/2018   HGB 14.3 01/14/2018   HCT 42.1 (A) 01/14/2018   MCV 91.8 01/14/2018   PLT 265 01/16/2016   Lab Results  Component Value Date   NA 143 06/30/2018   K 4.1  06/30/2018   CO2 24 06/30/2018   GLUCOSE 83 06/30/2018  BUN 19 06/30/2018   CREATININE 0.94 06/30/2018   BILITOT 0.4 01/16/2016   ALKPHOS 78 01/16/2016   AST 22 01/16/2016   ALT 20 01/16/2016   PROT 7.0 01/16/2016   ALBUMIN 4.4 01/16/2016   CALCIUM 9.6 06/30/2018   Lab Results  Component Value Date   CHOL 269 (H) 01/16/2016   Lab Results  Component Value Date   HDL 77 01/16/2016   Lab Results  Component Value Date   LDLCALC 164 (H) 01/16/2016   Lab Results  Component Value Date   TRIG 141 01/16/2016   Lab Results  Component Value Date   CHOLHDL 3.5 01/16/2016   No results found for: HGBA1C    Assessment & Plan:   Problem List Items Addressed This Visit   None   Visit Diagnoses    Skin lesion of right leg    -  Primary   Relevant Orders   Ambulatory referral to Dermatology      No orders of the defined types were placed in this encounter.   Follow-up: No follow-ups on file.   PLAN  Referral placed to Dr. Billy Fischer.  Will follow up with insurance to make sure there are no further issues.  Pt to return Prn to est care  Patient encouraged to call clinic with any questions, comments, or concerns.  Maximiano Coss, NP

## 2020-01-11 ENCOUNTER — Telehealth: Payer: Self-pay | Admitting: Registered Nurse

## 2020-01-11 NOTE — Telephone Encounter (Signed)
It appears that back on 11/23/2019 Janet Kramer over the pt's referral paperwork from the referral that was placed on 03/29/2019 to [facility] with [employer]. I have sent a message to [facility] regarding this matter.

## 2020-01-11 NOTE — Telephone Encounter (Signed)
Patient is calling regarding recent referral / [employer] states that they have not received anything on this referral /  Patient is supposed to be seen January 6th   And needs referral lapproved through [facility] pt is scheduled for surgery and states she can't afford until is straightned out, but she cant cancel it either   Please reach out to patient asap

## 2020-01-18 ENCOUNTER — Telehealth: Payer: Self-pay | Admitting: *Deleted

## 2020-01-18 NOTE — Telephone Encounter (Signed)
Patient declined due to schedule conflicts

## 2020-01-19 DIAGNOSIS — L578 Other skin changes due to chronic exposure to nonionizing radiation: Secondary | ICD-10-CM | POA: Diagnosis not present

## 2020-01-19 DIAGNOSIS — C44722 Squamous cell carcinoma of skin of right lower limb, including hip: Secondary | ICD-10-CM | POA: Diagnosis not present

## 2020-01-19 DIAGNOSIS — Z85828 Personal history of other malignant neoplasm of skin: Secondary | ICD-10-CM | POA: Diagnosis not present

## 2020-01-19 DIAGNOSIS — C44712 Basal cell carcinoma of skin of right lower limb, including hip: Secondary | ICD-10-CM | POA: Diagnosis not present

## 2020-01-19 DIAGNOSIS — L814 Other melanin hyperpigmentation: Secondary | ICD-10-CM | POA: Diagnosis not present

## 2020-01-24 ENCOUNTER — Telehealth: Payer: Self-pay | Admitting: *Deleted

## 2020-01-24 NOTE — Telephone Encounter (Addendum)
Hello Janet Kramer,  Dermatology is calling about a referral that looks like it was approved in system but they are saying there is a problem and are unable to book any more follow up appointments for her.   [employer] stated to the facility that the referral is not in there  from Primary Care doctor and are denying all her previous visits.

## 2020-01-25 DIAGNOSIS — H353131 Nonexudative age-related macular degeneration, bilateral, early dry stage: Secondary | ICD-10-CM | POA: Diagnosis not present

## 2020-01-25 DIAGNOSIS — Z961 Presence of intraocular lens: Secondary | ICD-10-CM | POA: Diagnosis not present

## 2020-01-25 DIAGNOSIS — H401131 Primary open-angle glaucoma, bilateral, mild stage: Secondary | ICD-10-CM | POA: Diagnosis not present

## 2020-03-26 ENCOUNTER — Other Ambulatory Visit: Payer: Self-pay

## 2020-03-26 ENCOUNTER — Ambulatory Visit (INDEPENDENT_AMBULATORY_CARE_PROVIDER_SITE_OTHER): Payer: Medicare HMO | Admitting: Family Medicine

## 2020-03-26 ENCOUNTER — Encounter: Payer: Self-pay | Admitting: Family Medicine

## 2020-03-26 VITALS — BP 110/76 | HR 107 | Temp 97.6°F | Resp 15 | Ht 64.0 in | Wt 140.8 lb

## 2020-03-26 DIAGNOSIS — L84 Corns and callosities: Secondary | ICD-10-CM

## 2020-03-26 DIAGNOSIS — E785 Hyperlipidemia, unspecified: Secondary | ICD-10-CM | POA: Diagnosis not present

## 2020-03-26 DIAGNOSIS — H401134 Primary open-angle glaucoma, bilateral, indeterminate stage: Secondary | ICD-10-CM | POA: Diagnosis not present

## 2020-03-26 DIAGNOSIS — M2021 Hallux rigidus, right foot: Secondary | ICD-10-CM

## 2020-03-26 DIAGNOSIS — N952 Postmenopausal atrophic vaginitis: Secondary | ICD-10-CM | POA: Diagnosis not present

## 2020-03-26 DIAGNOSIS — Z85828 Personal history of other malignant neoplasm of skin: Secondary | ICD-10-CM

## 2020-03-26 NOTE — Progress Notes (Unsigned)
Subjective:  Patient ID: Janet Kramer, female    DOB: 17-Jul-1945  Age: 75 y.o. MRN: [id number]  CC:  Chief Complaint  Patient presents with  . Establish Care    Pt reports doing well no concerns, stopped Estridiol due to feeling as tho the medication is working     HPI Janet Kramer presents for  Transfer of care, previous primary provider Dr. Pamella Kramer.  History of basal cell carcinoma, squamous cell carcinoma of right lower leg, seen by dermatology January 6, Dr. Billy Kramer with [facility] Mohs surgery [facility].  Dermatologist Dr. Kayleen Kramer in [place] - will be moving - will let me know who she would like to see.   Treated in November by Janet Kramer with OB/GYN for postmenopausal atrophic vaginitis with estradiol, she has since stopped that medication as did not feel second prescription worked (first one did).   Podiatry, Dr. Jacqualyn Kramer, office visit June 2021 with hallux rigidus of right foot.  Acquired adductovarus rotation of toe of right foot, corns, calluses, and capsulitis. Made changes in shoes. Having corn trimmed really helped. Corn has started to come back.   Glaucoma, followed by ophthalmology - Dr. Bing Kramer, Dr. Dennison Kramer.  Treated with Xalatan 1 drop OU nightly.   On Citracal +D for supplement.   On ASA QD for 10 years - helps with multiple issues - arthritis. Aware of bleeding risk - would like to continue same.   Cardiology Dr. Gwenlyn Kramer, evaluated for palpitations/tachycardia previously.  Event monitor reportedly was essentially normal as well as a 2D echo.  No further episode of palpitations on 08/24/2018 office visit. No return of palpitations.   Hyperlipidemia: Discussed by cardiology at 08/24/2018 visit.  Dietary modification versus low-dose statin therapy to be considered. Would not take a statin if elevated.  Last ate 4 hours ago.  Lab Results  Component Value Date   CHOL 269 (H) 01/16/2016   HDL 77 01/16/2016   LDLCALC 164 (H) 01/16/2016   TRIG 141 01/16/2016    CHOLHDL 3.5 01/16/2016   Lab Results  Component Value Date   ALT 20 01/16/2016   AST 22 01/16/2016   ALKPHOS 78 01/16/2016   BILITOT 0.4 01/16/2016      HM: Declines bone density testing, pneumonia, covid, and flu vaccines.        History Patient Active Problem List   Diagnosis Date Noted  . Hyperlipidemia 01/19/2018   Past Medical History:  Diagnosis Date  . Allergy   . Anemia    as a child  . Arthritis   . Asthma   . Bronchitis    has recurrent bronchitis  . Cataract   . Glaucoma   . Hyperlipidemia    Past Surgical History:  Procedure Laterality Date  . COLONOSCOPY    . [place]   right  . FRACTURE SURGERY  2001   ankle - right     . ROTATOR CUFF REPAIR    . TONSILLECTOMY  1949   Allergies  Allergen Reactions  . Codeine     Overly sleepy  . Oxycontin [Oxycodone Hcl] Nausea And Vomiting   Prior to Admission medications   Medication Sig Start Date End Date Taking? Authorizing Provider  aspirin 325 MG tablet Take 325 mg by mouth daily.   Yes [provider]  calcium citrate-vitamin D (CITRACAL+D) 315-200 MG-UNIT tablet Take 1 tablet by mouth daily.   Yes [provider]  latanoprost (XALATAN) 0.005 % ophthalmic solution Place 1 drop  into both eyes at bedtime.   Yes [provider]  Estradiol 10 MCG TABS vaginal tablet  06/01/19   [provider]   Social History   Socioeconomic History  . Marital status: Married    Spouse name: Not on file  . Number of children: 2  . Years of education: Not on file  . Highest education level: Not on file  Occupational History  . Occupation: Merchant navy officer: [employer]  Tobacco Use  . Smoking status: Never Smoker  . Smokeless tobacco: Never Used  Vaping Use  . Vaping Use: Never used  Substance and Sexual Activity  . Alcohol use: Yes    Alcohol/week: 2.0 standard drinks    Types: 2 Glasses of wine per week    Comment: occasional  . Drug use:  No  . Sexual activity: Not on file  Other Topics Concern  . 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      ADLs: independent with ADLs.      Advanced Directives: NO; FULL CODE; no prolonged measures; HCPOA: daughter not husband; Janet Kramer.   Social Determinants of Health   Financial Resource Strain: Not on file  Food Insecurity: Not on file  Transportation Needs: Not on file  Physical Activity: Not on file  Stress: Not on file  Social Connections: Not on file  Intimate Partner Violence: Not on file    Review of Systems   Objective:   Vitals:   03/26/20 1553  BP: 110/76  Pulse: (!) 107  Resp: 15  Temp: 97.6 F (36.4 C)  TempSrc: Temporal  SpO2: 98%  Weight: 140 lb 12.8 oz (63.9 kg)  Height: 5\' 4"  (1.626 m)     Physical Exam Skin:          38 minutes spent during visit, greater than 50% counseling and assimilation of information, chart review, and discussion of plan.    Assessment & Plan:  Janet Kramer is a 75 y.o. female . Corn of foot - Plan: Ambulatory referral to Podiatry Hallux rigidus of right foot - Plan: Ambulatory referral to Podiatry  -Recurrence of corn, refer to podiatry for ongoing care, treatment.  History of skin cancer  -Status post Mohs treatment of right lower leg with wound healing.  Routine follow-up with dermatology planned, but she will let me know who she would like to see and can place referral.    Primary open angle glaucoma (POAG) of both eyes, indeterminate stage  -Continue Xalatan drops, follow-up with ophthalmology.  Postmenopausal atrophic vaginitis  -Option of restart estradiol, or can discuss options with  OB/GYN.  Hyperlipidemia, unspecified hyperlipidemia type - Plan: Comprehensive metabolic panel, Lipid panel  -Lab only visit discussed for fasting labs.  She would not want to take statin, but can evaluate levels and other options.  No orders of the defined types were placed in this encounter.  Patient Instructions    Let me know who you would lie to see for dermatology.   I would consider restarting estradiol if return  of vaginal irritation, or discuss with your OBGYN.   I will refer you back to Dr. Jacqualyn Kramer for R foot issues.   We can check cholesterol next visit, or sooner if you would like. I placed a lab only order to be drawn at one of these locations at your convenience.   Here are a few lab drawing stations for you to have your labwork performed:   [place], [place], [place] [address]  [place],  [address]  Let me know if there are questions.   If you have lab work done today you will be contacted with your lab results within the next 2 weeks.  If you have not heard from [place] then please contact us. The fastest way to get your results is to register for My Chart.   IF you received an x-ray today, you will receive an invoice from [facility]. Please contact [facility] at [phone] with questions or concerns regarding your invoice.   IF you received labwork today, you will receive an invoice from [facility]. Please contact [facility] at [phone] with questions or concerns regarding your invoice.   Our billing staff will not be able to assist you with questions regarding bills from these companies.  You will be contacted with the lab results as soon as they are available. The fastest way to get your results is to activate your My Chart account. Instructions are located on the last Janet of this paperwork. If you have not heard from [place] regarding the results in 2 weeks, please contact this office.          Signed, Merri Ray, MD [facility]

## 2020-03-26 NOTE — Patient Instructions (Addendum)
  Let me know who you would lie to see for dermatology.   I would consider restarting estradiol if return of vaginal irritation, or discuss with your OBGYN.   I will refer you back to Dr. Jacqualyn Posey for R foot issues.   We can check cholesterol next visit, or sooner if you would like. I placed a lab only order to be drawn at one of these locations at your convenience.   Here are a few lab drawing stations for you to have your labwork performed:   [place], [place], [place] [address]  [address], [place] [address]  Let me know if there are questions.   If you have lab work done today you will be contacted with your lab results within the next 2 weeks.  If you have not heard from [place] then please contact us. The fastest way to get your results is to register for My Chart.   IF you received an x-ray today, you will receive an invoice from [facility]. Please contact [facility] at [phone] with questions or concerns regarding your invoice.   IF you received labwork today, you will receive an invoice from [facility]. Please contact [facility] at [phone] with questions or concerns regarding your invoice.   Our billing staff will not be able to assist you with questions regarding bills from these companies.  You will be contacted with the lab results as soon as they are available. The fastest way to get your results is to activate your My Chart account. Instructions are located on the last page of this paperwork. If you have not heard from [place] regarding the results in 2 weeks, please contact this office.

## 2020-03-27 ENCOUNTER — Encounter: Payer: Self-pay | Admitting: Family Medicine

## 2020-06-19 DIAGNOSIS — Z1231 Encounter for screening mammogram for malignant neoplasm of breast: Secondary | ICD-10-CM | POA: Diagnosis not present

## 2020-06-19 DIAGNOSIS — U071 COVID-19: Secondary | ICD-10-CM | POA: Diagnosis not present

## 2020-06-19 DIAGNOSIS — Z1152 Encounter for screening for COVID-19: Secondary | ICD-10-CM | POA: Diagnosis not present

## 2020-06-19 DIAGNOSIS — Z13820 Encounter for screening for osteoporosis: Secondary | ICD-10-CM | POA: Diagnosis not present

## 2020-06-19 DIAGNOSIS — J029 Acute pharyngitis, unspecified: Secondary | ICD-10-CM | POA: Diagnosis not present

## 2020-06-19 DIAGNOSIS — E785 Hyperlipidemia, unspecified: Secondary | ICD-10-CM | POA: Diagnosis not present

## 2020-07-04 DIAGNOSIS — H43811 Vitreous degeneration, right eye: Secondary | ICD-10-CM | POA: Diagnosis not present

## 2020-07-04 DIAGNOSIS — H353131 Nonexudative age-related macular degeneration, bilateral, early dry stage: Secondary | ICD-10-CM | POA: Diagnosis not present

## 2020-07-04 DIAGNOSIS — D3141 Benign neoplasm of right ciliary body: Secondary | ICD-10-CM | POA: Diagnosis not present

## 2020-07-04 DIAGNOSIS — H401131 Primary open-angle glaucoma, bilateral, mild stage: Secondary | ICD-10-CM | POA: Diagnosis not present

## 2020-08-02 DIAGNOSIS — Z780 Asymptomatic menopausal state: Secondary | ICD-10-CM | POA: Diagnosis not present

## 2020-08-02 DIAGNOSIS — M85851 Other specified disorders of bone density and structure, right thigh: Secondary | ICD-10-CM | POA: Diagnosis not present

## 2020-08-02 DIAGNOSIS — M85852 Other specified disorders of bone density and structure, left thigh: Secondary | ICD-10-CM | POA: Diagnosis not present

## 2020-08-02 DIAGNOSIS — Z1231 Encounter for screening mammogram for malignant neoplasm of breast: Secondary | ICD-10-CM | POA: Diagnosis not present

## 2020-10-12 DIAGNOSIS — E785 Hyperlipidemia, unspecified: Secondary | ICD-10-CM | POA: Diagnosis not present

## 2020-10-12 DIAGNOSIS — N1832 Chronic kidney disease, stage 3b: Secondary | ICD-10-CM | POA: Diagnosis not present

## 2020-10-12 DIAGNOSIS — M8589 Other specified disorders of bone density and structure, multiple sites: Secondary | ICD-10-CM | POA: Diagnosis not present

## 2020-10-19 DIAGNOSIS — N952 Postmenopausal atrophic vaginitis: Secondary | ICD-10-CM | POA: Diagnosis not present

## 2020-10-19 DIAGNOSIS — Z0000 Encounter for general adult medical examination without abnormal findings: Secondary | ICD-10-CM | POA: Diagnosis not present

## 2020-10-19 DIAGNOSIS — E785 Hyperlipidemia, unspecified: Secondary | ICD-10-CM | POA: Diagnosis not present

## 2020-10-19 DIAGNOSIS — Z1331 Encounter for screening for depression: Secondary | ICD-10-CM | POA: Diagnosis not present

## 2020-10-19 DIAGNOSIS — N1832 Chronic kidney disease, stage 3b: Secondary | ICD-10-CM | POA: Diagnosis not present

## 2020-10-19 DIAGNOSIS — M8589 Other specified disorders of bone density and structure, multiple sites: Secondary | ICD-10-CM | POA: Diagnosis not present

## 2020-10-19 DIAGNOSIS — Z1339 Encounter for screening examination for other mental health and behavioral disorders: Secondary | ICD-10-CM | POA: Diagnosis not present

## 2021-06-12 DIAGNOSIS — Z961 Presence of intraocular lens: Secondary | ICD-10-CM | POA: Diagnosis not present

## 2021-06-12 DIAGNOSIS — H353111 Nonexudative age-related macular degeneration, right eye, early dry stage: Secondary | ICD-10-CM | POA: Diagnosis not present

## 2021-06-12 DIAGNOSIS — H43811 Vitreous degeneration, right eye: Secondary | ICD-10-CM | POA: Diagnosis not present

## 2021-06-12 DIAGNOSIS — H401131 Primary open-angle glaucoma, bilateral, mild stage: Secondary | ICD-10-CM | POA: Diagnosis not present

## 2021-07-10 DIAGNOSIS — H353111 Nonexudative age-related macular degeneration, right eye, early dry stage: Secondary | ICD-10-CM | POA: Diagnosis not present

## 2021-07-10 DIAGNOSIS — H401131 Primary open-angle glaucoma, bilateral, mild stage: Secondary | ICD-10-CM | POA: Diagnosis not present

## 2021-10-25 DIAGNOSIS — M8580 Other specified disorders of bone density and structure, unspecified site: Secondary | ICD-10-CM | POA: Diagnosis not present

## 2021-10-25 DIAGNOSIS — N1832 Chronic kidney disease, stage 3b: Secondary | ICD-10-CM | POA: Diagnosis not present

## 2021-10-25 DIAGNOSIS — E785 Hyperlipidemia, unspecified: Secondary | ICD-10-CM | POA: Diagnosis not present

## 2021-10-25 DIAGNOSIS — R7989 Other specified abnormal findings of blood chemistry: Secondary | ICD-10-CM | POA: Diagnosis not present

## 2021-11-01 DIAGNOSIS — Z1331 Encounter for screening for depression: Secondary | ICD-10-CM | POA: Diagnosis not present

## 2021-11-01 DIAGNOSIS — Z1339 Encounter for screening examination for other mental health and behavioral disorders: Secondary | ICD-10-CM | POA: Diagnosis not present

## 2021-11-01 DIAGNOSIS — N1832 Chronic kidney disease, stage 3b: Secondary | ICD-10-CM | POA: Diagnosis not present

## 2021-11-01 DIAGNOSIS — N952 Postmenopausal atrophic vaginitis: Secondary | ICD-10-CM | POA: Diagnosis not present

## 2021-11-01 DIAGNOSIS — E785 Hyperlipidemia, unspecified: Secondary | ICD-10-CM | POA: Diagnosis not present

## 2021-11-01 DIAGNOSIS — Z0000 Encounter for general adult medical examination without abnormal findings: Secondary | ICD-10-CM | POA: Diagnosis not present

## 2021-11-01 DIAGNOSIS — M8589 Other specified disorders of bone density and structure, multiple sites: Secondary | ICD-10-CM | POA: Diagnosis not present

## 2021-11-01 DIAGNOSIS — M545 Low back pain, unspecified: Secondary | ICD-10-CM | POA: Diagnosis not present

## 2022-01-09 DIAGNOSIS — H401131 Primary open-angle glaucoma, bilateral, mild stage: Secondary | ICD-10-CM | POA: Diagnosis not present

## 2022-01-09 DIAGNOSIS — H43813 Vitreous degeneration, bilateral: Secondary | ICD-10-CM | POA: Diagnosis not present

## 2022-01-09 DIAGNOSIS — H353111 Nonexudative age-related macular degeneration, right eye, early dry stage: Secondary | ICD-10-CM | POA: Diagnosis not present

## 2022-01-09 DIAGNOSIS — Z961 Presence of intraocular lens: Secondary | ICD-10-CM | POA: Diagnosis not present

## 2022-02-14 DIAGNOSIS — H1131 Conjunctival hemorrhage, right eye: Secondary | ICD-10-CM | POA: Diagnosis not present

## 2022-03-24 IMAGING — DX DG FOOT COMPLETE 3+V*R*
3 series · 3 of 3 positions shown · non-contrast
Comparison: None.

CLINICAL DATA: Right foot pain.

EXAM:
RIGHT FOOT COMPLETE - 3+ VIEW

[foot ap]
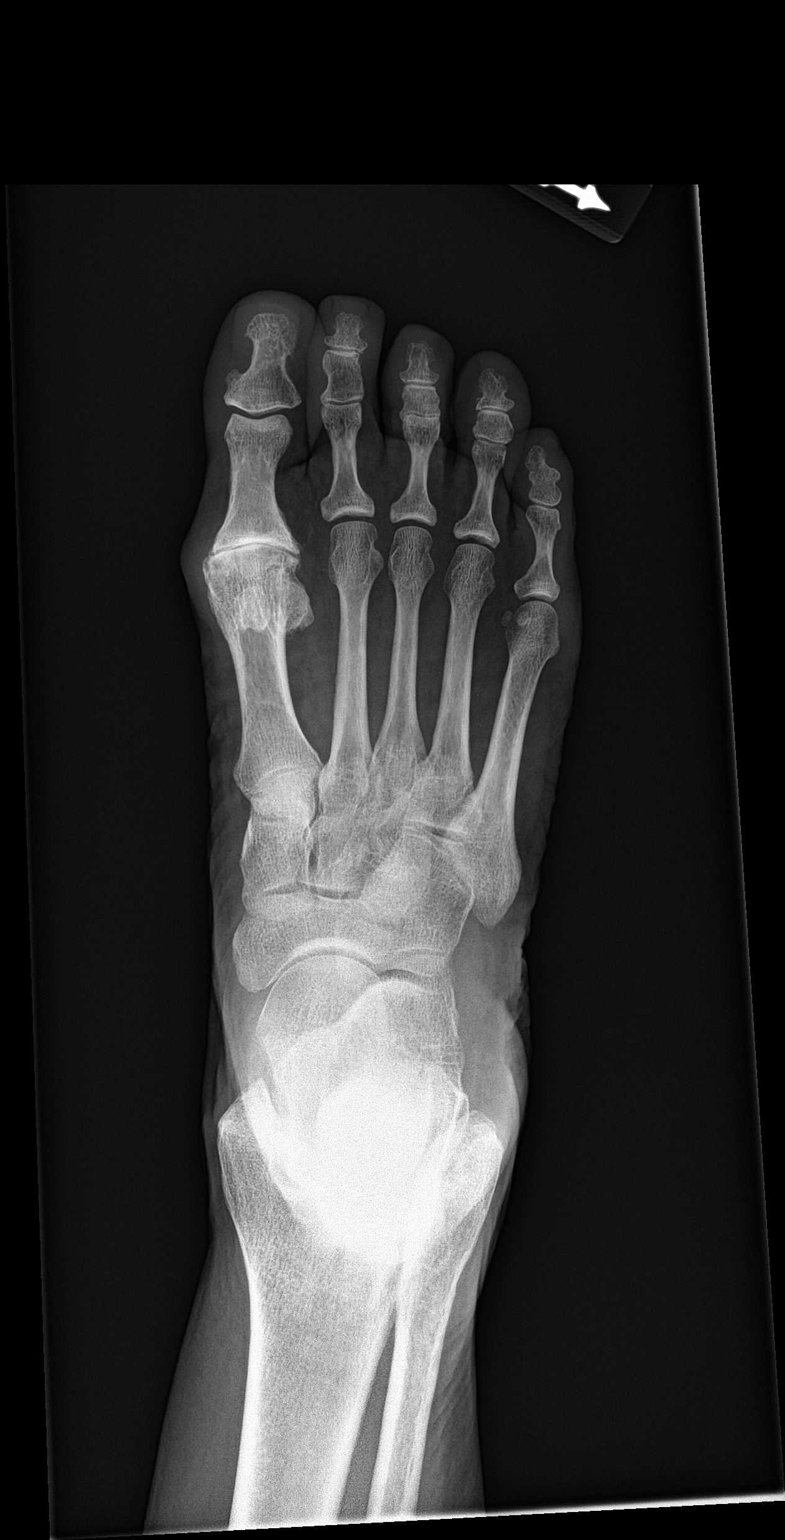

[foot obl]
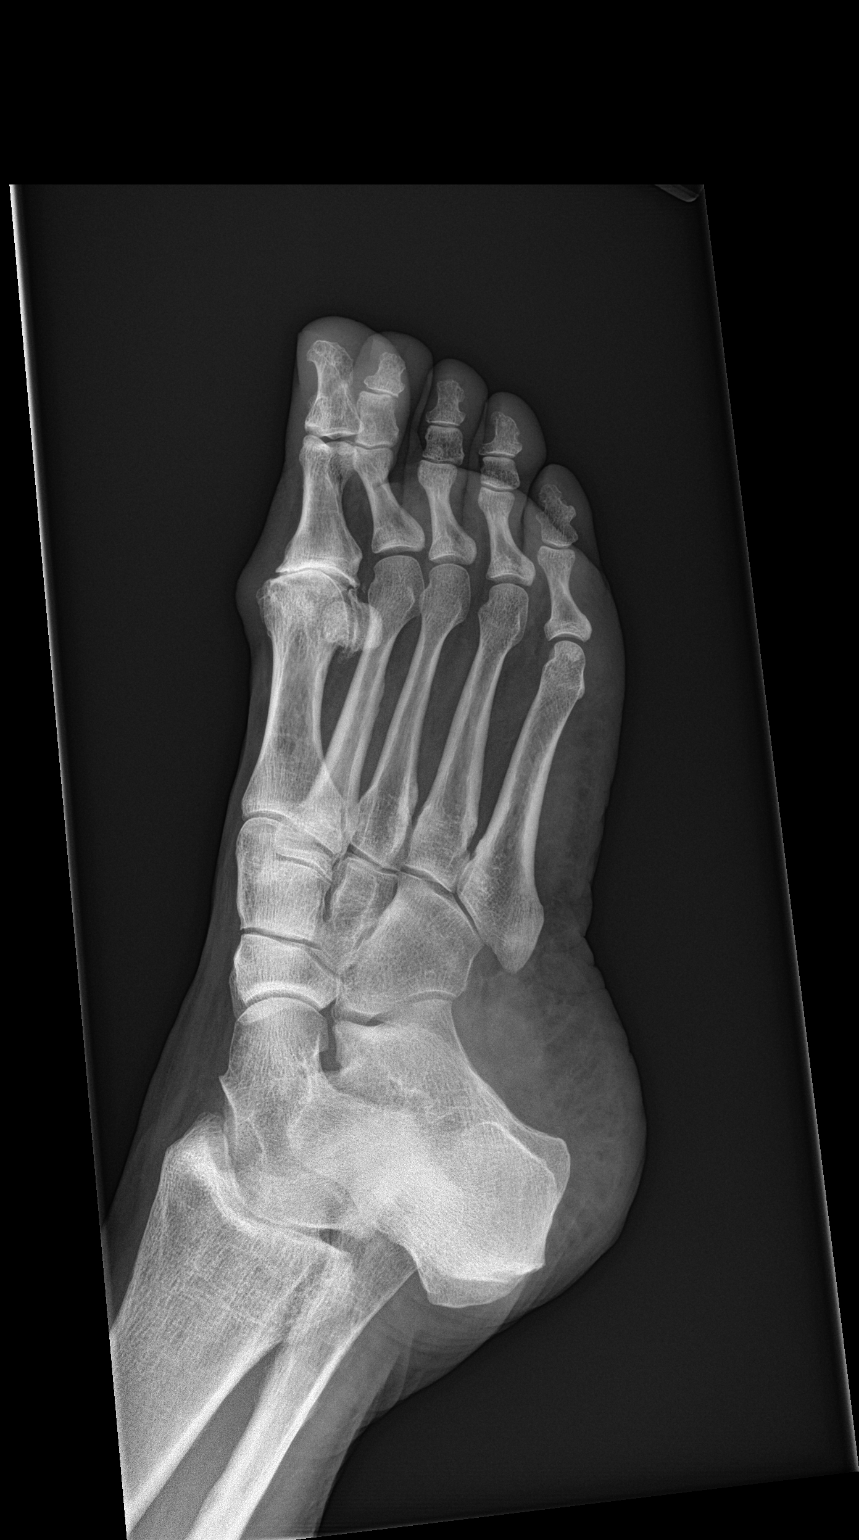

[foot lat]
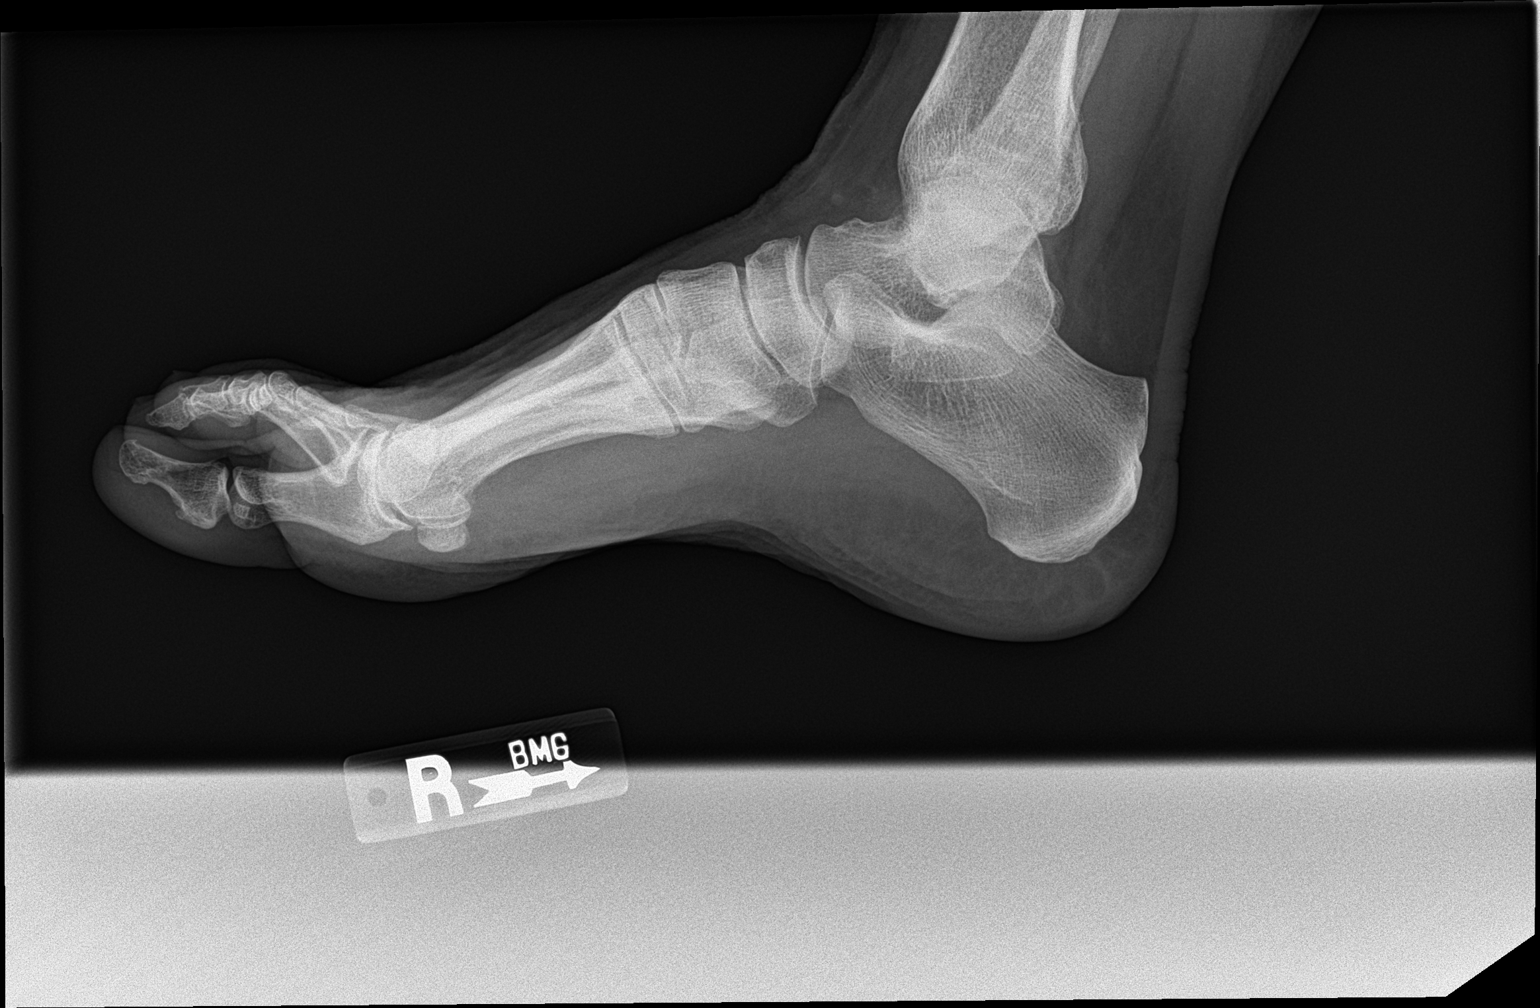

[3 of 3 positions shown; findings below may reference images not displayed]

FINDINGS: No acute fracture or dislocation. Moderate to severe first MTP joint
space narrowing with marginal osteophytes. Remaining joint spaces
are preserved. Bone mineralization is normal. Soft tissues are
unremarkable.
IMPRESSION: 1. Moderate to severe first MTP joint osteoarthritis.

## 2022-04-08 ENCOUNTER — Encounter: Payer: Self-pay | Admitting: Emergency Medicine

## 2022-04-08 ENCOUNTER — Ambulatory Visit (INDEPENDENT_AMBULATORY_CARE_PROVIDER_SITE_OTHER): Payer: Medicare HMO | Admitting: Emergency Medicine

## 2022-04-08 VITALS — BP 116/74 | HR 70 | Temp 98.1°F | Ht 64.0 in | Wt 137.5 lb

## 2022-04-08 DIAGNOSIS — Z7689 Persons encountering health services in other specified circumstances: Secondary | ICD-10-CM

## 2022-04-08 DIAGNOSIS — E785 Hyperlipidemia, unspecified: Secondary | ICD-10-CM | POA: Diagnosis not present

## 2022-04-08 DIAGNOSIS — M2550 Pain in unspecified joint: Secondary | ICD-10-CM

## 2022-04-08 LAB — CBC WITH DIFFERENTIAL/PLATELET
Basophils Absolute: 0 10*3/uL (ref 0.0–0.1)
Basophils Relative: 0.8 % (ref 0.0–3.0)
Eosinophils Absolute: 0.1 10*3/uL (ref 0.0–0.7)
Eosinophils Relative: 1.9 % (ref 0.0–5.0)
HCT: 40.2 % (ref 36.0–46.0)
Hemoglobin: 13.5 g/dL (ref 12.0–15.0)
Lymphocytes Relative: 19.2 % (ref 12.0–46.0)
Lymphs Abs: 1.1 10*3/uL (ref 0.7–4.0)
MCHC: 33.6 g/dL (ref 30.0–36.0)
MCV: 93.8 fl (ref 78.0–100.0)
Monocytes Absolute: 0.5 10*3/uL (ref 0.1–1.0)
Monocytes Relative: 8.1 % (ref 3.0–12.0)
Neutro Abs: 3.9 10*3/uL (ref 1.4–7.7)
Neutrophils Relative %: 70 % (ref 43.0–77.0)
Platelets: 235 10*3/uL (ref 150.0–400.0)
RBC: 4.28 Mil/uL (ref 3.87–5.11)
RDW: 13.5 % (ref 11.5–15.5)
WBC: 5.6 10*3/uL (ref 4.0–10.5)

## 2022-04-08 LAB — LIPID PANEL
Cholesterol: 250 mg/dL — ABNORMAL HIGH (ref 0–200)
HDL: 79.8 mg/dL (ref >39.00)
LDL Cholesterol: 153 mg/dL — ABNORMAL HIGH (ref 0–99)
NonHDL: 169.81
Total CHOL/HDL Ratio: 3
Triglycerides: 85 mg/dL (ref 0.0–149.0)
VLDL: 17 mg/dL (ref 0.0–40.0)

## 2022-04-08 LAB — COMPREHENSIVE METABOLIC PANEL
ALT: 14 U/L (ref 0–35)
AST: 20 U/L (ref 0–37)
Albumin: 4.3 g/dL (ref 3.5–5.2)
Alkaline Phosphatase: 57 U/L (ref 39–117)
BUN: 23 mg/dL (ref 6–23)
CO2: 28 mEq/L (ref 19–32)
Calcium: 9.6 mg/dL (ref 8.4–10.5)
Chloride: 104 mEq/L (ref 96–112)
Creatinine, Ser: 1.06 mg/dL (ref 0.40–1.20)
GFR: 50.12 mL/min — ABNORMAL LOW (ref >60.00)
Glucose, Bld: 95 mg/dL (ref 70–99)
Potassium: 4.4 mEq/L (ref 3.5–5.1)
Sodium: 138 mEq/L (ref 135–145)
Total Bilirubin: 0.7 mg/dL (ref 0.2–1.2)
Total Protein: 7.2 g/dL (ref 6.0–8.3)

## 2022-04-08 LAB — HEMOGLOBIN A1C: Hgb A1c MFr Bld: 5.8 % (ref 4.6–6.5)

## 2022-04-08 NOTE — Assessment & Plan Note (Signed)
Cardiovascular risks associated with dyslipidemia discussed Diet and nutrition discussed Lipid profile done today

## 2022-04-08 NOTE — Assessment & Plan Note (Signed)
No deformities or inflammation on physical exam Not interfering with activities of daily life No red flag signs or symptoms Pain management discussed Mild and not affecting quality of life too much.

## 2022-04-08 NOTE — Progress Notes (Signed)
Janet Kramer 77 y.o.   Chief Complaint  Patient presents with   New Patient (Initial Visit)    Referral Gyn, arthritis in her hands, patient states its getting worse    HISTORY OF PRESENT ILLNESS: This is a 77 y.o. female A1A first visit to this office, here to establish care with me. Healthy female with a healthy lifestyle History of dyslipidemia.  No medications Needs GYN referral Occasional pain to fingers and hands No other complaints or medical concerns today.  HPI   Prior to Admission medications   Medication Sig Start Date End Date Taking? Authorizing Provider  aspirin 325 MG tablet Take 325 mg by mouth daily.   Yes [provider]  latanoprost (XALATAN) 0.005 % ophthalmic solution Place 1 drop into both eyes at bedtime.   Yes [provider]  calcium citrate-vitamin D (CITRACAL+D) 315-200 MG-UNIT tablet Take 1 tablet by mouth daily.    [provider]    Allergies  Allergen Reactions   Codeine     Overly sleepy   Oxycontin [Oxycodone Hcl] Nausea And Vomiting    Patient Active Problem List   Diagnosis Date Noted   Hyperlipidemia 01/19/2018    Past Medical History:  Diagnosis Date   Allergy    Anemia    as a child   Arthritis    Asthma    Bronchitis    has recurrent bronchitis   Cataract    Glaucoma    Hyperlipidemia     Past Surgical History:  Procedure Laterality Date   COLONOSCOPY     EYE SURGERY  1975   right   FRACTURE SURGERY  2001   ankle - right      ROTATOR CUFF REPAIR     TONSILLECTOMY  1949    Social History   Socioeconomic History   Marital status: Married    Spouse name: Not on file   Number of children: 2   Years of education: Not on file   Highest education level: Not on file  Occupational History   Occupation: Merchant navy officer: [place]  Tobacco Use   Smoking status: Never   Smokeless tobacco: Never  Vaping Use   Vaping Use: Never used  Substance and Sexual Activity    Alcohol use: Yes    Alcohol/week: 2.0 standard drinks of alcohol    Types: 2 Glasses of wine per week    Comment: Occasional beer with [place] food   Drug use: No   Sexual activity: Not Currently    Birth control/protection: None  Other Topics Concern   Not on file  Social History Narrative   Marital status: married x 79 years; second marriage      Children: 2 children; 5 grandchildren; no gg      Lives: with spouse      Employment: Librarian, 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      ADLs: independent with ADLs.      Advanced Directives: NO; FULL CODE; no prolonged measures; HCPOA: daughter not husband; [name].   Social Determinants of Health   Financial Resource Strain: Not on file  Food Insecurity: Not on file  Transportation Needs: Not on file  Physical Activity: Not on file  Stress: Not on file  Social Connections: Not on file  Intimate Partner Violence: Not on file    Family History  Problem Relation Age of Onset   Cancer Mother        colon   Colon cancer Mother 31   Cancer Father        lung   Diabetes Paternal Grandmother    Stroke Maternal Grandmother    Heart disease Maternal Grandfather    Colon polyps Daughter    Vision loss Maternal Aunt    Stomach cancer Neg Hx    Esophageal cancer Neg Hx    Rectal cancer Neg Hx      Review of Systems  Constitutional: Negative.  Negative for chills and fever.  HENT: Negative.  Negative for congestion and sore throat.   Respiratory: Negative.  Negative for cough and shortness of breath.   Cardiovascular: Negative.  Negative for chest pain and palpitations.  Gastrointestinal:  Negative for abdominal pain, diarrhea, nausea and vomiting.  Musculoskeletal:  Positive for joint pain.  Skin: Negative.   Neurological: Negative.  Negative for dizziness and headaches.  All other systems  reviewed and are negative.   Vitals:   04/08/22 0919  BP: 116/74  Pulse: 70  Temp: 98.1 F (36.7 C)  SpO2: 98%    Physical Exam Vitals reviewed.  Constitutional:      Appearance: Normal appearance.  HENT:     Head: Normocephalic.     Right Ear: Tympanic membrane, ear canal and external ear normal.     Left Ear: Tympanic membrane, ear canal and external ear normal.     Mouth/Throat:     Mouth: Mucous membranes are moist.     Pharynx: Oropharynx is clear.  Eyes:     Extraocular Movements: Extraocular movements intact.     Conjunctiva/sclera: Conjunctivae normal.     Pupils: Pupils are equal, round, and reactive to light.  Cardiovascular:     Rate and Rhythm: Normal rate and regular rhythm.     Pulses: Normal pulses.     Heart sounds: Normal heart sounds.  Pulmonary:     Effort: Pulmonary effort is normal.     Breath sounds: Normal breath sounds.  Abdominal:     Palpations: Abdomen is soft.     Tenderness: There is no abdominal tenderness.  Musculoskeletal:     Cervical back: No tenderness.     Right lower leg: No edema.     Left lower leg: No edema.  Lymphadenopathy:     Cervical: No cervical adenopathy.  Skin:    General: Skin is warm and dry.  Neurological:     General: No focal deficit present.     Mental Status: She is alert and oriented to person, place, and time.  Psychiatric:        Mood and Affect: Mood normal.        Behavior: Behavior normal.      ASSESSMENT & PLAN: A total of 43 minutes was spent with the patient and counseling/coordination of care regarding preparing for this visit, establishing care with me, review of available medical records, review of most recent blood work results, comprehensive history and physical examination, education on nutrition, cardiovascular risks associated with dyslipidemia, prognosis, documentation, and need for follow-up.  Problem List Items Addressed This Visit       Other   Dyslipidemia - Primary     Cardiovascular risks associated with dyslipidemia discussed Diet and nutrition discussed Lipid profile done today       Relevant Orders   Comprehensive metabolic panel  CBC with Differential/Platelet   Hemoglobin A1c   Lipid panel   Arthralgia of multiple joints    No deformities or inflammation on physical exam Not interfering with activities of daily life No red flag signs or symptoms Pain management discussed Mild and not affecting quality of life too much.      Other Visit Diagnoses     Encounter to establish care       Relevant Orders   Ambulatory referral to Gynecology      Patient Instructions  Health Maintenance After Age 22 After age 65, you are at a higher risk for certain long-term diseases and infections as well as injuries from falls. Falls are a major cause of broken bones and head injuries in people who are older than age 26. Getting regular preventive care can help to keep you healthy and well. Preventive care includes getting regular testing and making lifestyle changes as recommended by your health care provider. Talk with your health care provider about: Which screenings and tests you should have. A screening is a test that checks for a disease when you have no symptoms. A diet and exercise plan that is right for you. What should I know about screenings and tests to prevent falls? Screening and testing are the best ways to find a health problem early. Early diagnosis and treatment give you the best chance of managing medical conditions that are common after age 40. Certain conditions and lifestyle choices may make you more likely to have a fall. Your health care provider may recommend: Regular vision checks. Poor vision and conditions such as cataracts can make you more likely to have a fall. If you wear glasses, make sure to get your prescription updated if your vision changes. Medicine review. Work with your health care provider to regularly review all of the  medicines you are taking, including over-the-counter medicines. Ask your health care provider about any side effects that may make you more likely to have a fall. Tell your health care provider if any medicines that you take make you feel dizzy or sleepy. Strength and balance checks. Your health care provider may recommend certain tests to check your strength and balance while standing, walking, or changing positions. Foot health exam. Foot pain and numbness, as well as not wearing proper footwear, can make you more likely to have a fall. Screenings, including: Osteoporosis screening. Osteoporosis is a condition that causes the bones to get weaker and break more easily. Blood pressure screening. Blood pressure changes and medicines to control blood pressure can make you feel dizzy. Depression screening. You may be more likely to have a fall if you have a fear of falling, feel depressed, or feel unable to do activities that you used to do. Alcohol use screening. Using too much alcohol can affect your balance and may make you more likely to have a fall. Follow these instructions at home: Lifestyle Do not drink alcohol if: Your health care provider tells you not to drink. If you drink alcohol: Limit how much you have to: 0-1 drink a day for women. 0-2 drinks a day for men. Know how much alcohol is in your drink. In the [place] one drink equals one 12 oz bottle of beer (355 mL), one 5 oz glass of wine (148 mL), or one 1 oz glass of hard liquor (44 mL). Do not use any products that contain nicotine or tobacco. These products include cigarettes, chewing tobacco, and vaping devices, such as e-cigarettes. If you need  help quitting, ask your health care provider. Activity  Follow a regular exercise program to stay fit. This will help you maintain your balance. Ask your health care provider what types of exercise are appropriate for you. If you need a cane or walker, use it as recommended by your health  care provider. Wear supportive shoes that have nonskid soles. Safety  Remove any tripping hazards, such as rugs, cords, and clutter. Install safety equipment such as grab bars in bathrooms and safety rails on stairs. Keep rooms and walkways well-lit. General instructions Talk with your health care provider about your risks for falling. Tell your health care provider if: You fall. Be sure to tell your health care provider about all falls, even ones that seem minor. You feel dizzy, tiredness (fatigue), or off-balance. Take over-the-counter and prescription medicines only as told by your health care provider. These include supplements. Eat a healthy diet and maintain a healthy weight. A healthy diet includes low-fat dairy products, low-fat (lean) meats, and fiber from whole grains, beans, and lots of fruits and vegetables. Stay current with your vaccines. Schedule regular health, dental, and eye exams. Summary Having a healthy lifestyle and getting preventive care can help to protect your health and wellness after age 66. Screening and testing are the best way to find a health problem early and help you avoid having a fall. Early diagnosis and treatment give you the best chance for managing medical conditions that are more common for people who are older than age 42. Falls are a major cause of broken bones and head injuries in people who are older than age 22. Take precautions to prevent a fall at home. Work with your health care provider to learn what changes you can make to improve your health and wellness and to prevent falls. This information is not intended to replace advice given to you by your health care provider. Make sure you discuss any questions you have with your health care provider. Document Revised: 05/21/2020 Document Reviewed: 05/21/2020 Elsevier Patient Education  [place], MD [facility]

## 2022-04-08 NOTE — Patient Instructions (Signed)
Health Maintenance After Age 79 After age 79, you are at a higher risk for certain long-term diseases and infections as well as injuries from falls. Falls are a major cause of broken bones and head injuries in people who are older than age 79. Getting regular preventive care can help to keep you healthy and well. Preventive care includes getting regular testing and making lifestyle changes as recommended by your health care provider. Talk with your health care provider about: Which screenings and tests you should have. A screening is a test that checks for a disease when you have no symptoms. A diet and exercise plan that is right for you. What should I know about screenings and tests to prevent falls? Screening and testing are the best ways to find a health problem early. Early diagnosis and treatment give you the best chance of managing medical conditions that are common after age 79. Certain conditions and lifestyle choices may make you more likely to have a fall. Your health care provider may recommend: Regular vision checks. Poor vision and conditions such as cataracts can make you more likely to have a fall. If you wear glasses, make sure to get your prescription updated if your vision changes. Medicine review. Work with your health care provider to regularly review all of the medicines you are taking, including over-the-counter medicines. Ask your health care provider about any side effects that may make you more likely to have a fall. Tell your health care provider if any medicines that you take make you feel dizzy or sleepy. Strength and balance checks. Your health care provider may recommend certain tests to check your strength and balance while standing, walking, or changing positions. Foot health exam. Foot pain and numbness, as well as not wearing proper footwear, can make you more likely to have a fall. Screenings, including: Osteoporosis screening. Osteoporosis is a condition that causes  the bones to get weaker and break more easily. Blood pressure screening. Blood pressure changes and medicines to control blood pressure can make you feel dizzy. Depression screening. You may be more likely to have a fall if you have a fear of falling, feel depressed, or feel unable to do activities that you used to do. Alcohol use screening. Using too much alcohol can affect your balance and may make you more likely to have a fall. Follow these instructions at home: Lifestyle Do not drink alcohol if: Your health care provider tells you not to drink. If you drink alcohol: Limit how much you have to: 0-1 drink a day for women. 0-2 drinks a day for men. Know how much alcohol is in your drink. In the [place] one drink equals one 12 oz bottle of beer (355 mL), one 5 oz glass of wine (148 mL), or one 1 oz glass of hard liquor (44 mL). Do not use any products that contain nicotine or tobacco. These products include cigarettes, chewing tobacco, and vaping devices, such as e-cigarettes. If you need help quitting, ask your health care provider. Activity  Follow a regular exercise program to stay fit. This will help you maintain your balance. Ask your health care provider what types of exercise are appropriate for you. If you need a cane or walker, use it as recommended by your health care provider. Wear supportive shoes that have nonskid soles. Safety  Remove any tripping hazards, such as rugs, cords, and clutter. Install safety equipment such as grab bars in bathrooms and safety rails on stairs. Keep rooms and walkways   well-lit. General instructions Talk with your health care provider about your risks for falling. Tell your health care provider if: You fall. Be sure to tell your health care provider about all falls, even ones that seem minor. You feel dizzy, tiredness (fatigue), or off-balance. Take over-the-counter and prescription medicines only as told by your health care provider. These include  supplements. Eat a healthy diet and maintain a healthy weight. A healthy diet includes low-fat dairy products, low-fat (lean) meats, and fiber from whole grains, beans, and lots of fruits and vegetables. Stay current with your vaccines. Schedule regular health, dental, and eye exams. Summary Having a healthy lifestyle and getting preventive care can help to protect your health and wellness after age 79. Screening and testing are the best way to find a health problem early and help you avoid having a fall. Early diagnosis and treatment give you the best chance for managing medical conditions that are more common for people who are older than age 79. Falls are a major cause of broken bones and head injuries in people who are older than age 79. Take precautions to prevent a fall at home. Work with your health care provider to learn what changes you can make to improve your health and wellness and to prevent falls. This information is not intended to replace advice given to you by your health care provider. Make sure you discuss any questions you have with your health care provider. Document Revised: 05/21/2020 Document Reviewed: 05/21/2020 Elsevier Patient Education  2023 [employer] Inc[employer]  

## 2022-10-27 ENCOUNTER — Ambulatory Visit (INDEPENDENT_AMBULATORY_CARE_PROVIDER_SITE_OTHER): Payer: Medicare HMO | Admitting: Emergency Medicine

## 2022-10-27 ENCOUNTER — Encounter: Payer: Self-pay | Admitting: Emergency Medicine

## 2022-10-27 VITALS — BP 120/74 | HR 72 | Temp 98.1°F | Ht 64.0 in | Wt 128.5 lb

## 2022-10-27 DIAGNOSIS — J069 Acute upper respiratory infection, unspecified: Secondary | ICD-10-CM | POA: Insufficient documentation

## 2022-10-27 NOTE — Patient Instructions (Signed)

## 2022-10-27 NOTE — Assessment & Plan Note (Signed)
Clinically stable.  Running its course without complications. Continue over-the-counter Mucinex. Advised to rest and stay well-hydrated No concerns identified during this visit Advised to contact the office if no better or worse during the next several days.

## 2022-10-27 NOTE — Progress Notes (Signed)
Janet Kramer 77 y.o.   Chief Complaint  Patient presents with   Medical Management of Chronic Issues    Patient states she has a sore throat, cough, slight fever.     HISTORY OF PRESENT ILLNESS: Acute problem visit today. This is a 77 y.o. female complaining of flulike symptoms that started last Wednesday and have progressively gotten better.  Today feels about 70% better. Still has a lingering cough but overall much improved. No other complaints or medical concerns today.  HPI   Prior to Admission medications   Medication Sig Start Date End Date Taking? Authorizing Provider  aspirin 325 MG tablet Take 325 mg by mouth daily.    [provider]  calcium citrate-vitamin D (CITRACAL+D) 315-200 MG-UNIT tablet Take 1 tablet by mouth daily.    [provider]  latanoprost (XALATAN) 0.005 % ophthalmic solution Place 1 drop into both eyes at bedtime.    [provider]    Allergies  Allergen Reactions   Codeine     Overly sleepy   Oxycontin [Oxycodone Hcl] Nausea And Vomiting    Patient Active Problem List   Diagnosis Date Noted   Arthralgia of multiple joints 04/08/2022   Dyslipidemia 01/19/2018    Past Medical History:  Diagnosis Date   Allergy    Anemia    as a child   Arthritis    Asthma    Bronchitis    has recurrent bronchitis   Cataract    Glaucoma    Hyperlipidemia     Past Surgical History:  Procedure Laterality Date   COLONOSCOPY     EYE SURGERY  1975   right   FRACTURE SURGERY  2001   ankle - right      ROTATOR CUFF REPAIR     TONSILLECTOMY  1949    Social History   Socioeconomic History   Marital status: Married    Spouse name: Not on file   Number of children: 2   Years of education: Not on file   Highest education level: Not on file  Occupational History   Occupation: Data processing manager    Employer: [employer]  Tobacco Use   Smoking status: Never   Smokeless tobacco: Never  Vaping Use   Vaping  status: Never Used  Substance and Sexual Activity   Alcohol use: Yes    Alcohol/week: 2.0 standard drinks of alcohol    Types: 2 Glasses of wine per week    Comment: Occasional beer with [place] food   Drug use: No   Sexual activity: Not Currently    Birth control/protection: None  Other Topics Concern   Not on file  Social History Narrative   Marital status: married x 38 years; second marriage      Children: 2 children; 5 grandchildren; no gg      Lives: with spouse      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      ADLs: independent with ADLs.      Advanced Directives: NO; FULL CODE; no prolonged measures; HCPOA: daughter not husband; Thornell Sartorius.   Social Determinants of Health   Financial Resource Strain: Not on file  Food Insecurity: Not on file  Transportation Needs: Not on file  Physical Activity: Not on file  Stress: Not on  file  Social Connections: Not on file  Intimate Partner Violence: Not on file    Family History  Problem Relation Age of Onset   Cancer Mother        colon   Colon cancer Mother 34   Cancer Father        lung   Diabetes Paternal Grandmother    Stroke Maternal Grandmother    Heart disease Maternal Grandfather    Colon polyps Daughter    Vision loss Maternal Aunt    Stomach cancer Neg Hx    Esophageal cancer Neg Hx    Rectal cancer Neg Hx      Review of Systems  Constitutional:  Positive for fever.  HENT:  Positive for congestion.   Respiratory:  Positive for cough. Negative for shortness of breath.   Cardiovascular:  Negative for chest pain and palpitations.  Gastrointestinal:  Negative for abdominal pain, diarrhea, nausea and vomiting.  Genitourinary: Negative.  Negative for dysuria and hematuria.  Neurological:  Negative for dizziness and headaches.    Today's Vitals   10/27/22 1305   BP: 120/74  Pulse: 72  Temp: 98.1 F (36.7 C)  TempSrc: Oral  SpO2: 99%  Weight: 128 lb 8 oz (58.3 kg)  Height: 5\' 4"  (1.626 m)   Body mass index is 22.06 kg/m.   Physical Exam Vitals reviewed.  Constitutional:      Appearance: Normal appearance.  HENT:     Head: Normocephalic.  Eyes:     Extraocular Movements: Extraocular movements intact.  Cardiovascular:     Rate and Rhythm: Normal rate.  Pulmonary:     Effort: Pulmonary effort is normal.  Skin:    General: Skin is warm and dry.  Neurological:     Mental Status: She is alert and oriented to person, place, and time.  Psychiatric:        Mood and Affect: Mood normal.        Behavior: Behavior normal.      ASSESSMENT & PLAN: A total of 32 minutes was spent with the patient and counseling/coordination of care regarding preparing for this visit, review of most recent office visit notes, review of chronic medical conditions and review of all medications, diagnosis of upper viral respiratory infection and management, prognosis, documentation, and need for follow-up if no better or worse during the next several days.  Problem List Items Addressed This Visit       Respiratory   Viral upper respiratory infection - Primary    Clinically stable.  Running its course without complications. Continue over-the-counter Mucinex. Advised to rest and stay well-hydrated No concerns identified during this visit Advised to contact the office if no better or worse during the next several days.      Patient Instructions  Viral Respiratory Infection A viral respiratory infection is an illness that affects parts of the body that are used for breathing. These include the lungs, nose, and throat. It is caused by a germ called a virus. Some examples of this kind of infection are: A cold. The flu (influenza). A respiratory syncytial virus (RSV) infection. What are the causes? This condition is caused by a virus. It spreads from  person to person. You can get the virus if: You breathe in droplets from someone who is sick. You come in contact with people who are sick. You touch mucus or other fluid from a person who is sick. What are the signs or symptoms? Symptoms of this condition include: A stuffy or  runny nose. A sore throat. A cough. Shortness of breath. Trouble breathing. Yellow or green fluid in the nose. Other symptoms may include: A fever. Sweating or chills. Tiredness (fatigue). Achy muscles. A headache. How is this treated? This condition may be treated with: Medicines that treat viruses. Medicines that make it easy to breathe. Medicines that are sprayed into the nose. Acetaminophen or NSAIDs, such as ibuprofen, to treat fever. Follow these instructions at home: Managing pain and congestion Take over-the-counter and prescription medicines only as told by your doctor. If you have a sore throat, gargle with salt water. Do this 3-4 times a day or as needed. To make salt water, dissolve -1 tsp (3-6 g) of salt in 1 cup (237 mL) of warm water. Make sure that all the salt dissolves. Use nose drops made from salt water. This helps with stuffiness (congestion). It also helps soften the skin around your nose. Take 2 tsp (10 mL) of honey at bedtime to lessen coughing at night. Do not give honey to children who are younger than 50 year old. Drink enough fluid to keep your pee (urine) pale yellow. General instructions  Rest as much as possible. Do not drink alcohol. Do not smoke or use any products that contain nicotine or tobacco. If you need help quitting, ask your doctor. Keep all follow-up visits. How is this prevented?     Get a flu shot every year. Ask your doctor when you should get your flu shot. Do not let other people get your germs. If you are sick: Wash your hands with soap and water often. Wash your hands after you cough or sneeze. Wash hands for at least 20 seconds. If you cannot use  soap and water, use hand sanitizer. Cover your mouth when you cough. Cover your nose and mouth when you sneeze. Do not share cups or eating utensils. Clean commonly used objects often. Clean commonly touched surfaces. Stay home from work or school. Avoid contact with people who are sick during cold and flu season. This is in fall and winter. Get help if: Your symptoms last for 10 days or longer. Your symptoms get worse over time. You have very bad pain in your face or forehead. Parts of your jaw or neck get very swollen. You have shortness of breath. Get help right away if: You feel pain or pressure in your chest. You have trouble breathing. You faint or feel like you will faint. You keep vomiting and it gets worse. You feel confused. These symptoms may be an emergency. Get help right away. Call your local emergency services (911 in the [place] Do not wait to see if the symptoms will go away. Do not drive yourself to the hospital. Summary A viral respiratory infection is an illness that affects parts of the body that are used for breathing. Examples of this illness include a cold, the flu, and a respiratory syncytial virus (RSV) infection. The infection can cause a runny nose, cough, sore throat, and fever. Follow what your doctor tells you about taking medicines, drinking lots of fluid, washing your hands, resting at home, and avoiding people who are sick. This information is not intended to replace advice given to you by your health care provider. Make sure you discuss any questions you have with your health care provider. Document Revised: 04/05/2020 Document Reviewed: 04/05/2020 Elsevier Patient Education  2024 Elsevier Inc.     Edwina Barth, MD [facility]

## 2022-11-27 ENCOUNTER — Ambulatory Visit: Payer: Medicare HMO | Admitting: Emergency Medicine

## 2022-11-27 ENCOUNTER — Encounter: Payer: Self-pay | Admitting: Emergency Medicine

## 2022-11-27 ENCOUNTER — Ambulatory Visit
Admission: RE | Admit: 2022-11-27 | Discharge: 2022-11-27 | Disposition: A | Discharge status: Home / Self Care | Payer: Medicare HMO | Source: Ambulatory Visit | Attending: Emergency Medicine | Admitting: Emergency Medicine

## 2022-11-27 VITALS — BP 108/84 | HR 69 | Temp 97.7°F | Ht 64.0 in | Wt 132.4 lb

## 2022-11-27 DIAGNOSIS — Z0000 Encounter for general adult medical examination without abnormal findings: Secondary | ICD-10-CM

## 2022-11-27 DIAGNOSIS — E785 Hyperlipidemia, unspecified: Secondary | ICD-10-CM | POA: Diagnosis not present

## 2022-11-27 DIAGNOSIS — M2550 Pain in unspecified joint: Secondary | ICD-10-CM | POA: Diagnosis not present

## 2022-11-27 DIAGNOSIS — Z13820 Encounter for screening for osteoporosis: Secondary | ICD-10-CM

## 2022-11-27 DIAGNOSIS — Z1231 Encounter for screening mammogram for malignant neoplasm of breast: Secondary | ICD-10-CM

## 2022-11-27 DIAGNOSIS — Z130 Encounter for screening for diseases of the blood and blood-forming organs and certain disorders involving the immune mechanism: Secondary | ICD-10-CM

## 2022-11-27 NOTE — Patient Instructions (Signed)

## 2022-11-27 NOTE — Progress Notes (Signed)
Janet Kramer 77 y.o.   Chief Complaint  Patient presents with   Annual Exam    HISTORY OF PRESENT ILLNESS: This is a 77 y.o. female here for annual exam. Overall doing well.  Has no complaints or medical concerns today.  HPI   Prior to Admission medications   Medication Sig Start Date End Date Taking? Authorizing Provider  aspirin 325 MG tablet Take 325 mg by mouth daily.   Yes [provider]  calcium citrate-vitamin D (CITRACAL+D) 315-200 MG-UNIT tablet Take 1 tablet by mouth daily.   Yes [provider]  latanoprost (XALATAN) 0.005 % ophthalmic solution Place 1 drop into both eyes at bedtime.   Yes [provider]    Allergies  Allergen Reactions   Codeine     Overly sleepy   Oxycontin [Oxycodone Hcl] Nausea And Vomiting    Patient Active Problem List   Diagnosis Date Noted   Arthralgia of multiple joints 04/08/2022   Dyslipidemia 01/19/2018    Past Medical History:  Diagnosis Date   Allergy    Anemia    as a child   Arthritis    Asthma    Bronchitis    has recurrent bronchitis   Cataract    Glaucoma    Hyperlipidemia     Past Surgical History:  Procedure Laterality Date   COLONOSCOPY     EYE SURGERY  1975   right   FRACTURE SURGERY  2001   ankle - right      ROTATOR CUFF REPAIR     TONSILLECTOMY  1949    Social History   Socioeconomic History   Marital status: Married    Spouse name: Not on file   Number of children: 2   Years of education: Not on file   Highest education level: Not on file  Occupational History   Occupation: Data processing manager    Employer: [employer]  Tobacco Use   Smoking status: Never   Smokeless tobacco: Never  Vaping Use   Vaping status: Never Used  Substance and Sexual Activity   Alcohol use: Yes    Alcohol/week: 2.0 standard drinks of alcohol    Types: 2 Glasses of wine per week    Comment: Occasional beer with [place] food   Drug use: No   Sexual activity: Not Currently     Birth control/protection: None  Other Topics Concern   Not on file  Social History Narrative   Marital status: married x 38 years; second marriage      Children: 2 children; 5 grandchildren; no gg      Lives: with spouse      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      ADLs: independent with ADLs.      Advanced Directives: NO; FULL CODE; no prolonged measures; HCPOA: daughter not husband; Thornell Sartorius.   Social Determinants of Health   Financial Resource Strain: Not on file  Food Insecurity: Not on file  Transportation Needs: Not on file  Physical Activity: Not on file  Stress: Not on file  Social Connections: Not on file  Intimate Partner Violence: Not on file    Family History  Problem Relation Age of Onset   Cancer Mother        colon   Colon cancer  Mother 19   Cancer Father        lung   Diabetes Paternal Grandmother    Stroke Maternal Grandmother    Heart disease Maternal Grandfather    Colon polyps Daughter    Vision loss Maternal Aunt    Stomach cancer Neg Hx    Esophageal cancer Neg Hx    Rectal cancer Neg Hx      Review of Systems  Constitutional: Negative.  Negative for chills and fever.  HENT: Negative.  Negative for congestion and sore throat.   Respiratory: Negative.  Negative for cough and shortness of breath.   Cardiovascular: Negative.  Negative for chest pain and palpitations.  Gastrointestinal:  Negative for abdominal pain, diarrhea, nausea and vomiting.  Genitourinary: Negative.  Negative for dysuria and hematuria.  Skin: Negative.  Negative for rash.  Neurological: Negative.  Negative for dizziness and headaches.  All other systems reviewed and are negative.   Today's Vitals   11/27/22 0827  BP: 108/84  Pulse: 69  Temp: 97.7 F (36.5 C)  TempSrc: Oral  SpO2: 99%  Weight: 132 lb 6 oz (60 kg)   Height: 5\' 4"  (1.626 m)   Body mass index is 22.72 kg/m.   Physical Exam Vitals reviewed.  Constitutional:      Appearance: Normal appearance.  HENT:     Head: Normocephalic.     Right Ear: Tympanic membrane, ear canal and external ear normal.     Left Ear: Tympanic membrane, ear canal and external ear normal.     Mouth/Throat:     Mouth: Mucous membranes are moist.     Pharynx: Oropharynx is clear.  Eyes:     Extraocular Movements: Extraocular movements intact.     Conjunctiva/sclera: Conjunctivae normal.     Pupils: Pupils are equal, round, and reactive to light.  Cardiovascular:     Rate and Rhythm: Normal rate and regular rhythm.     Pulses: Normal pulses.     Heart sounds: Normal heart sounds.  Pulmonary:     Effort: Pulmonary effort is normal.     Breath sounds: Normal breath sounds.  Abdominal:     Palpations: Abdomen is soft.     Tenderness: There is no abdominal tenderness.  Musculoskeletal:     Cervical back: No tenderness.  Lymphadenopathy:     Cervical: No cervical adenopathy.  Skin:    General: Skin is warm and dry.  Neurological:     Mental Status: She is alert and oriented to person, place, and time.  Psychiatric:        Mood and Affect: Mood normal.        Behavior: Behavior normal.      ASSESSMENT & PLAN: Problem List Items Addressed This Visit       Other   Dyslipidemia   Relevant Orders   Lipid panel   Arthralgia of multiple joints   Other Visit Diagnoses     Routine general medical examination at a health care facility    -  Primary   Relevant Orders   CBC with Differential   Comprehensive metabolic panel   Hemoglobin A1c   Lipid panel   Screening mammogram for breast cancer       Relevant Orders   MM Digital Screening   Osteoporosis screening       Relevant Orders   DG Bone Density   Screening for deficiency anemia       Relevant Orders   CBC with Differential   Screening  for endocrine, metabolic and immunity disorder        Relevant Orders   Comprehensive metabolic panel   Hemoglobin A1c      Modifiable risk factors discussed with patient. Anticipatory guidance according to age provided. The following topics were also discussed: Social Determinants of Health Smoking.  Non-smoker Diet and nutrition Benefits of exercise Cancer screening and need for mammogram Vaccinations review and recommendations Cardiovascular risk assessment Mental health including depression and anxiety Fall and accident prevention  Patient Instructions  Health Maintenance, Female Adopting a healthy lifestyle and getting preventive care are important in promoting health and wellness. Ask your health care provider about: The right schedule for you to have regular tests and exams. Things you can do on your own to prevent diseases and keep yourself healthy. What should I know about diet, weight, and exercise? Eat a healthy diet  Eat a diet that includes plenty of vegetables, fruits, low-fat dairy products, and lean protein. Do not eat a lot of foods that are high in solid fats, added sugars, or sodium. Maintain a healthy weight Body mass index (BMI) is used to identify weight problems. It estimates body fat based on height and weight. Your health care provider can help determine your BMI and help you achieve or maintain a healthy weight. Get regular exercise Get regular exercise. This is one of the most important things you can do for your health. Most adults should: Exercise for at least 150 minutes each week. The exercise should increase your heart rate and make you sweat (moderate-intensity exercise). Do strengthening exercises at least twice a week. This is in addition to the moderate-intensity exercise. Spend less time sitting. Even light physical activity can be beneficial. Watch cholesterol and blood lipids Have your blood tested for lipids and cholesterol at 79 years of age, then have this test every 5 years. Have your  cholesterol levels checked more often if: Your lipid or cholesterol levels are high. You are older than 79 years of age. You are at high risk for heart disease. What should I know about cancer screening? Depending on your health history and family history, you may need to have cancer screening at various ages. This may include screening for: Breast cancer. Cervical cancer. Colorectal cancer. Skin cancer. Lung cancer. What should I know about heart disease, diabetes, and high blood pressure? Blood pressure and heart disease High blood pressure causes heart disease and increases the risk of stroke. This is more likely to develop in people who have high blood pressure readings or are overweight. Have your blood pressure checked: Every 3-5 years if you are 49-43 years of age. Every year if you are 54 years old or older. Diabetes Have regular diabetes screenings. This checks your fasting blood sugar level. Have the screening done: Once every three years after age 11 if you are at a normal weight and have a low risk for diabetes. More often and at a younger age if you are overweight or have a high risk for diabetes. What should I know about preventing infection? Hepatitis B If you have a higher risk for hepatitis B, you should be screened for this virus. Talk with your health care provider to find out if you are at risk for hepatitis B infection. Hepatitis C Testing is recommended for: Everyone born from [birth year] through 1967. Anyone with known risk factors for hepatitis C. Sexually transmitted infections (STIs) Get screened for STIs, including gonorrhea and chlamydia, if: You are sexually active and are younger  than 79 years of age. You are older than 79 years of age and your health care provider tells you that you are at risk for this type of infection. Your sexual activity has changed since you were last screened, and you are at increased risk for chlamydia or gonorrhea. Ask your health care  provider if you are at risk. Ask your health care provider about whether you are at high risk for HIV. Your health care provider may recommend a prescription medicine to help prevent HIV infection. If you choose to take medicine to prevent HIV, you should first get tested for HIV. You should then be tested every 3 months for as long as you are taking the medicine. Pregnancy If you are about to stop having your period (premenopausal) and you may become pregnant, seek counseling before you get pregnant. Take 400 to 800 micrograms (mcg) of folic acid every day if you become pregnant. Ask for birth control (contraception) if you want to prevent pregnancy. Osteoporosis and menopause Osteoporosis is a disease in which the bones lose minerals and strength with aging. This can result in bone fractures. If you are 72 years old or older, or if you are at risk for osteoporosis and fractures, ask your health care provider if you should: Be screened for bone loss. Take a calcium or vitamin D supplement to lower your risk of fractures. Be given hormone replacement therapy (HRT) to treat symptoms of menopause. Follow these instructions at home: Alcohol use Do not drink alcohol if: Your health care provider tells you not to drink. You are pregnant, may be pregnant, or are planning to become pregnant. If you drink alcohol: Limit how much you have to: 0-1 drink a day. Know how much alcohol is in your drink. In the [place] one drink equals one 12 oz bottle of beer (355 mL), one 5 oz glass of wine (148 mL), or one 1 oz glass of hard liquor (44 mL). Lifestyle Do not use any products that contain nicotine or tobacco. These products include cigarettes, chewing tobacco, and vaping devices, such as e-cigarettes. If you need help quitting, ask your health care provider. Do not use street drugs. Do not share needles. Ask your health care provider for help if you need support or information about quitting drugs. General  instructions Schedule regular health, dental, and eye exams. Stay current with your vaccines. Tell your health care provider if: You often feel depressed. You have ever been abused or do not feel safe at home. Summary Adopting a healthy lifestyle and getting preventive care are important in promoting health and wellness. Follow your health care provider's instructions about healthy diet, exercising, and getting tested or screened for diseases. Follow your health care provider's instructions on monitoring your cholesterol and blood pressure. This information is not intended to replace advice given to you by your health care provider. Make sure you discuss any questions you have with your health care provider. Document Revised: 05/21/2020 Document Reviewed: 05/21/2020 Elsevier Patient Education  2024 Elsevier Inc.     Edwina Barth, MD [facility]

## 2023-01-28 DIAGNOSIS — H353111 Nonexudative age-related macular degeneration, right eye, early dry stage: Secondary | ICD-10-CM | POA: Diagnosis not present

## 2023-01-28 DIAGNOSIS — H524 Presbyopia: Secondary | ICD-10-CM | POA: Diagnosis not present

## 2023-01-28 DIAGNOSIS — H401131 Primary open-angle glaucoma, bilateral, mild stage: Secondary | ICD-10-CM | POA: Diagnosis not present

## 2023-06-23 ENCOUNTER — Encounter: Payer: Self-pay | Admitting: Emergency Medicine

## 2023-06-23 ENCOUNTER — Ambulatory Visit (INDEPENDENT_AMBULATORY_CARE_PROVIDER_SITE_OTHER)

## 2023-06-23 ENCOUNTER — Ambulatory Visit: Payer: Self-pay | Admitting: Emergency Medicine

## 2023-06-23 ENCOUNTER — Ambulatory Visit (INDEPENDENT_AMBULATORY_CARE_PROVIDER_SITE_OTHER): Admitting: Emergency Medicine

## 2023-06-23 VITALS — BP 124/70 | HR 73 | Temp 98.3°F | Ht 64.0 in | Wt 127.0 lb

## 2023-06-23 DIAGNOSIS — Z1329 Encounter for screening for other suspected endocrine disorder: Secondary | ICD-10-CM

## 2023-06-23 DIAGNOSIS — Z13220 Encounter for screening for lipoid disorders: Secondary | ICD-10-CM | POA: Diagnosis not present

## 2023-06-23 DIAGNOSIS — Z0000 Encounter for general adult medical examination without abnormal findings: Secondary | ICD-10-CM

## 2023-06-23 DIAGNOSIS — L989 Disorder of the skin and subcutaneous tissue, unspecified: Secondary | ICD-10-CM | POA: Diagnosis not present

## 2023-06-23 DIAGNOSIS — I454 Nonspecific intraventricular block: Secondary | ICD-10-CM

## 2023-06-23 DIAGNOSIS — Z13228 Encounter for screening for other metabolic disorders: Secondary | ICD-10-CM | POA: Diagnosis not present

## 2023-06-23 DIAGNOSIS — Z130 Encounter for screening for diseases of the blood and blood-forming organs and certain disorders involving the immune mechanism: Secondary | ICD-10-CM | POA: Diagnosis not present

## 2023-06-23 DIAGNOSIS — R5383 Other fatigue: Secondary | ICD-10-CM | POA: Diagnosis not present

## 2023-06-23 NOTE — Patient Instructions (Signed)

## 2023-06-23 NOTE — Progress Notes (Signed)
 Janet Kramer 78 y.o.   Chief Complaint  Patient presents with   Annual Exam    Patient her for physical. Patient wanting to join a gym but wanting to know if she is physical well to continue with this. She mentions that she still having a spot on her right hand     HISTORY OF PRESENT ILLNESS: This is a 78 y.o. female here for annual exam and evaluation before starting exercising at the gym. Concerned about a spot on right forearm that is been there for a couple months. BP Readings from Last 3 Encounters:  11/27/22 108/84  10/27/22 120/74  04/08/22 116/74   Wt Readings from Last 3 Encounters:  11/27/22 132 lb 6 oz (60 kg)  10/27/22 128 lb 8 oz (58.3 kg)  04/08/22 137 lb 8 oz (62.4 kg)     HPI   Prior to Admission medications   Medication Sig Start Date End Date Taking? Authorizing Provider  aspirin 325 MG tablet Take 325 mg by mouth daily.    [provider]  calcium citrate-vitamin D (CITRACAL+D) 315-200 MG-UNIT tablet Take 1 tablet by mouth daily.    [provider]  latanoprost (XALATAN) 0.005 % ophthalmic solution Place 1 drop into both eyes at bedtime.    [provider]    Allergies  Allergen Reactions   Codeine     Overly sleepy   Oxycontin [Oxycodone Hcl] Nausea And Vomiting    Patient Active Problem List   Diagnosis Date Noted   Arthralgia of multiple joints 04/08/2022   Dyslipidemia 01/19/2018    Past Medical History:  Diagnosis Date   Allergy    Anemia    as a child   Arthritis    Asthma    Bronchitis    has recurrent bronchitis   Cataract    Glaucoma    Hyperlipidemia     Past Surgical History:  Procedure Laterality Date   COLONOSCOPY     EYE SURGERY  1975   right   FRACTURE SURGERY  2001   ankle - right      ROTATOR CUFF REPAIR     TONSILLECTOMY  1949    Social History   Socioeconomic History   Marital status: Married    Spouse name: Not on file   Number of children: 2   Years of education: Not on  file   Highest education level: Not on file  Occupational History   Occupation: Chemical engineer: [employer]  Tobacco Use   Smoking status: Never   Smokeless tobacco: Never  Vaping Use   Vaping status: Never Used  Substance and Sexual Activity   Alcohol  use: Yes    Alcohol /week: 2.0 standard drinks of alcohol     Types: 2 Glasses of wine per week    Comment: Occasional beer with [place] food   Drug use: No   Sexual activity: Not Currently    Birth control/protection: None  Other Topics Concern   Not on file  Social History Narrative   Marital status: married x 38 years; second marriage      Children: 2 children; 5 grandchildren; no gg      Lives: with spouse      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  ADLs: independent with ADLs.      Advanced Directives: NO; FULL CODE; no prolonged measures; HCPOA: daughter not husband; Loman Risk.   Social Drivers of Corporate investment banker Strain: Not on file  Food Insecurity: Not on file  Transportation Needs: Not on file  Physical Activity: Not on file  Stress: Not on file  Social Connections: Not on file  Intimate Partner Violence: Not on file    Family History  Problem Relation Age of Onset   Cancer Mother        colon   Colon cancer Mother 22   Cancer Father        lung   Diabetes Paternal Grandmother    Stroke Maternal Grandmother    Heart disease Maternal Grandfather    Colon polyps Daughter    Vision loss Maternal Aunt    Stomach cancer Neg Hx    Esophageal cancer Neg Hx    Rectal cancer Neg Hx      Review of Systems  Constitutional: Negative.  Negative for chills and fever.  HENT:  Negative for congestion and sore throat.   Respiratory: Negative.  Negative for cough and shortness of breath.   Cardiovascular: Negative.  Negative for  chest pain and palpitations.  Gastrointestinal:  Negative for abdominal pain, diarrhea, nausea and vomiting.  Genitourinary: Negative.  Negative for dysuria and hematuria.  Skin: Negative.  Negative for rash.  Neurological: Negative.  Negative for dizziness and headaches.  All other systems reviewed and are negative.   Today's Vitals   06/23/23 1313  BP: 124/70  Pulse: 73  Temp: 98.3 F (36.8 C)  TempSrc: Oral  SpO2: 97%  Weight: 127 lb (57.6 kg)  Height: 5\' 4"  (1.626 m)   Body mass index is 21.8 kg/m.   Physical Exam Vitals reviewed.  Constitutional:      Appearance: Normal appearance.  HENT:     Head: Normocephalic.     Mouth/Throat:     Mouth: Mucous membranes are moist.     Pharynx: Oropharynx is clear.  Eyes:     Extraocular Movements: Extraocular movements intact.     Conjunctiva/sclera: Conjunctivae normal.     Pupils: Pupils are equal, round, and reactive to light.  Cardiovascular:     Rate and Rhythm: Normal rate and regular rhythm.     Pulses: Normal pulses.     Heart sounds: Normal heart sounds.  Pulmonary:     Effort: Pulmonary effort is normal.     Breath sounds: Normal breath sounds.  Abdominal:     Palpations: Abdomen is soft.     Tenderness: There is no abdominal tenderness.  Musculoskeletal:     Right lower leg: No edema.     Left lower leg: No edema.  Skin:    General: Skin is warm and dry.     Capillary Refill: Capillary refill takes less than 2 seconds.  Neurological:     Mental Status: She is alert and oriented to person, place, and time.  Psychiatric:        Mood and Affect: Mood normal.        Behavior: Behavior normal.   EKG: Normal sinus rhythm with ventricular rate of 65/min.  Incomplete right bundle branch block.  No acute ischemic changes.   ASSESSMENT & PLAN: Problem List Items Addressed This Visit   None Visit Diagnoses       Routine general medical examination at a health care facility    -  Primary  Relevant Orders    CBC with Differential/Platelet   Comprehensive metabolic panel with GFR   Hemoglobin A1c   Lipid panel   Vitamin B12   VITAMIN D 25 Hydroxy (Vit-D Deficiency, Fractures)   TSH   EKG 12-Lead   DG Chest 2 View     Screening for deficiency anemia       Relevant Orders   CBC with Differential/Platelet     Screening for lipoid disorders       Relevant Orders   Lipid panel     Screening for endocrine, metabolic and immunity disorder       Relevant Orders   Comprehensive metabolic panel with GFR   Hemoglobin A1c   Vitamin B12   VITAMIN D 25 Hydroxy (Vit-D Deficiency, Fractures)   TSH     Skin lesion       Relevant Orders   Ambulatory referral to Dermatology      Modifiable risk factors discussed with patient. Anticipatory guidance according to age provided. The following topics were also discussed: Social Determinants of Health Smoking.  Non-smoker Diet and nutrition Benefits of exercise Cancer screening and need for bone density scan.  Already scheduled. Vaccinations review and recommendations Cardiovascular risk assessment and need for blood work Mental health including depression and anxiety Fall and accident prevention   Patient Instructions  Health Maintenance, Female Adopting a healthy lifestyle and getting preventive care are important in promoting health and wellness. Ask your health care provider about: The right schedule for you to have regular tests and exams. Things you can do on your own to prevent diseases and keep yourself healthy. What should I know about diet, weight, and exercise? Eat a healthy diet  Eat a diet that includes plenty of vegetables, fruits, low-fat dairy products, and lean protein. Do not eat a lot of foods that are high in solid fats, added sugars, or sodium. Maintain a healthy weight Body mass index (BMI) is used to identify weight problems. It estimates body fat based on height and weight. Your health care provider can help determine  your BMI and help you achieve or maintain a healthy weight. Get regular exercise Get regular exercise. This is one of the most important things you can do for your health. Most adults should: Exercise for at least 150 minutes each week. The exercise should increase your heart rate and make you sweat (moderate-intensity exercise). Do strengthening exercises at least twice a week. This is in addition to the moderate-intensity exercise. Spend less time sitting. Even light physical activity can be beneficial. Watch cholesterol and blood lipids Have your blood tested for lipids and cholesterol at 80 years of age, then have this test every 5 years. Have your cholesterol levels checked more often if: Your lipid or cholesterol levels are high. You are older than 80 years of age. You are at high risk for heart disease. What should I know about cancer screening? Depending on your health history and family history, you may need to have cancer screening at various ages. This may include screening for: Breast cancer. Cervical cancer. Colorectal cancer. Skin cancer. Lung cancer. What should I know about heart disease, diabetes, and high blood pressure? Blood pressure and heart disease High blood pressure causes heart disease and increases the risk of stroke. This is more likely to develop in people who have high blood pressure readings or are overweight. Have your blood pressure checked: Every 3-5 years if you are 61-10 years of age. Every year if you are  23 years old or older. Diabetes Have regular diabetes screenings. This checks your fasting blood sugar level. Have the screening done: Once every three years after age 74 if you are at a normal weight and have a low risk for diabetes. More often and at a younger age if you are overweight or have a high risk for diabetes. What should I know about preventing infection? Hepatitis B If you have a higher risk for hepatitis B, you should be screened for  this virus. Talk with your health care provider to find out if you are at risk for hepatitis B infection. Hepatitis C Testing is recommended for: Everyone born from [birth year] through 1967. Anyone with known risk factors for hepatitis C. Sexually transmitted infections (STIs) Get screened for STIs, including gonorrhea and chlamydia, if: You are sexually active and are younger than 80 years of age. You are older than 80 years of age and your health care provider tells you that you are at risk for this type of infection. Your sexual activity has changed since you were last screened, and you are at increased risk for chlamydia or gonorrhea. Ask your health care provider if you are at risk. Ask your health care provider about whether you are at high risk for HIV. Your health care provider may recommend a prescription medicine to help prevent HIV infection. If you choose to take medicine to prevent HIV, you should first get tested for HIV. You should then be tested every 3 months for as long as you are taking the medicine. Pregnancy If you are about to stop having your period (premenopausal) and you may become pregnant, seek counseling before you get pregnant. Take 400 to 800 micrograms (mcg) of folic acid every day if you become pregnant. Ask for birth control (contraception) if you want to prevent pregnancy. Osteoporosis and menopause Osteoporosis is a disease in which the bones lose minerals and strength with aging. This can result in bone fractures. If you are 74 years old or older, or if you are at risk for osteoporosis and fractures, ask your health care provider if you should: Be screened for bone loss. Take a calcium or vitamin D supplement to lower your risk of fractures. Be given hormone replacement therapy (HRT) to treat symptoms of menopause. Follow these instructions at home: Alcohol  use Do not drink alcohol  if: Your health care provider tells you not to drink. You are pregnant, may be  pregnant, or are planning to become pregnant. If you drink alcohol : Limit how much you have to: 0-1 drink a day. Know how much alcohol  is in your drink. In the [place] one drink equals one 12 oz bottle of beer (355 mL), one 5 oz glass of wine (148 mL), or one 1 oz glass of hard liquor (44 mL). Lifestyle Do not use any products that contain nicotine or tobacco. These products include cigarettes, chewing tobacco, and vaping devices, such as e-cigarettes. If you need help quitting, ask your health care provider. Do not use street drugs. Do not share needles. Ask your health care provider for help if you need support or information about quitting drugs. General instructions Schedule regular health, dental, and eye exams. Stay current with your vaccines. Tell your health care provider if: You often feel depressed. You have ever been abused or do not feel safe at home. Summary Adopting a healthy lifestyle and getting preventive care are important in promoting health and wellness. Follow your health care provider's instructions about healthy diet, exercising, and  getting tested or screened for diseases. Follow your health care provider's instructions on monitoring your cholesterol and blood pressure. This information is not intended to replace advice given to you by your health care provider. Make sure you discuss any questions you have with your health care provider. Document Revised: 05/21/2020 Document Reviewed: 05/21/2020 Elsevier Patient Education  2024 Elsevier Inc.    Maryagnes Small, MD [facility]

## 2023-07-16 ENCOUNTER — Other Ambulatory Visit (HOSPITAL_BASED_OUTPATIENT_CLINIC_OR_DEPARTMENT_OTHER)

## 2023-07-16 ENCOUNTER — Other Ambulatory Visit: Payer: Medicare HMO

## 2023-07-28 ENCOUNTER — Other Ambulatory Visit

## 2023-07-29 DIAGNOSIS — H401131 Primary open-angle glaucoma, bilateral, mild stage: Secondary | ICD-10-CM | POA: Diagnosis not present

## 2023-08-04 ENCOUNTER — Ambulatory Visit (INDEPENDENT_AMBULATORY_CARE_PROVIDER_SITE_OTHER)
Admission: RE | Admit: 2023-08-04 | Discharge: 2023-08-04 | Disposition: A | Discharge status: Home / Self Care | Source: Ambulatory Visit | Attending: Emergency Medicine | Admitting: Emergency Medicine

## 2023-08-04 ENCOUNTER — Encounter: Payer: Self-pay | Admitting: Dermatology

## 2023-08-04 ENCOUNTER — Ambulatory Visit: Admitting: Dermatology

## 2023-08-04 VITALS — BP 103/64 | HR 87

## 2023-08-04 DIAGNOSIS — Z872 Personal history of diseases of the skin and subcutaneous tissue: Secondary | ICD-10-CM | POA: Diagnosis not present

## 2023-08-04 DIAGNOSIS — D492 Neoplasm of unspecified behavior of bone, soft tissue, and skin: Secondary | ICD-10-CM | POA: Diagnosis not present

## 2023-08-04 DIAGNOSIS — L578 Other skin changes due to chronic exposure to nonionizing radiation: Secondary | ICD-10-CM

## 2023-08-04 DIAGNOSIS — Z85828 Personal history of other malignant neoplasm of skin: Secondary | ICD-10-CM

## 2023-08-04 DIAGNOSIS — D485 Neoplasm of uncertain behavior of skin: Secondary | ICD-10-CM

## 2023-08-04 DIAGNOSIS — Z13820 Encounter for screening for osteoporosis: Secondary | ICD-10-CM

## 2023-08-04 DIAGNOSIS — D0361 Melanoma in situ of right upper limb, including shoulder: Secondary | ICD-10-CM | POA: Diagnosis not present

## 2023-08-04 DIAGNOSIS — L219 Seborrheic dermatitis, unspecified: Secondary | ICD-10-CM | POA: Diagnosis not present

## 2023-08-04 MED ORDER — CLOBETASOL PROPIONATE 0.05 % EX SOLN: 1.0000 | Freq: Two times a day (BID) | CUTANEOUS | 2 refills | Status: AC

## 2023-08-04 NOTE — Progress Notes (Signed)
   New Patient Visit   Subjective  Janet Kramer is a 78 y.o. female who presents for the following: growth   Pt has growth on right arm since spring that she'd like evaluated. Lesion is asymptomatic. Previous hx of NMSC on right lower leg.   The following portions of the chart were reviewed this encounter and updated as appropriate: medications, allergies, medical history  Review of Systems:  No other skin or systemic complaints except as noted in HPI or Assessment and Plan.  Objective  Well appearing patient in no apparent distress; mood and affect are within normal limits.  A focused examination was performed of the following areas: Arms face  Relevant exam findings are noted in the Assessment and Plan.  Right Forearm 1.5 cm irregularly pigmented tan/brown macule   Assessment & Plan   HISTORY OF PRECANCEROUS ACTINIC KERATOSIS - site(s) of PreCancerous Actinic Keratosis clear today. - these may recur and new lesions may form requiring treatment to prevent transformation into skin cancer - observe for new or changing spots and contact [facility] for appointment if occur - photoprotection with sun protective clothing; sunglasses and broad spectrum sunscreen with SPF of at least 30 + and frequent self skin exams recommended - yearly exams by a dermatologist recommended for persons with history of PreCancerous Actinic Keratoses   HISTORY OF BASAL CELL CARCINOMA OF THE SKIN right lower leg several years ago - No evidence of recurrence today - Recommend regular full body skin exams - Recommend daily broad spectrum sunscreen SPF 30+ to sun-exposed areas, reapply every 2 hours as needed.  - Call if any new or changing lesions are noted between office visits    SEBORRHEIC DERMATITIS Exam: Pink patches with greasy scale at ears  Chronic and persistent condition with duration or expected duration over one year. Condition is bothersome/symptomatic for patient. Currently  flared.   Seborrheic Dermatitis is a chronic persistent rash characterized by pinkness and scaling most commonly of the mid face but also can occur on the scalp (dandruff), ears; mid chest, mid back and groin.  It tends to be exacerbated by stress and cooler weather.  People who have neurologic disease may experience new onset or exacerbation of existing seborrheic dermatitis.  The condition is not curable but treatable and can be controlled.  Treatment Plan: Clobetasol  solution to ears bid prn   NEOPLASM OF UNCERTAIN BEHAVIOR OF SKIN Right Forearm Skin / nail biopsy Type of biopsy: tangential   Informed consent: discussed and consent obtained   Timeout: patient name, date of birth, surgical site, and procedure verified   Procedure prep:  Patient was prepped and draped in usual sterile fashion Prep type:  Chlorhexidine Anesthesia: the lesion was anesthetized in a standard fashion   Anesthetic:  1% lidocaine  w/ epinephrine 1-100,000 buffered w/ 8.4% NaHCO3 Instrument used: DermaBlade   Hemostasis achieved with: suture, pressure and electrodesiccation   Outcome: patient tolerated procedure well   Post-procedure details: sterile dressing applied and wound care instructions given   Dressing type: bandage and pressure dressing    Specimen 1 - Surgical pathology Differential Diagnosis: R/O MM vs DN vs lentigo  Check Margins: No ACTINIC SKIN DAMAGE    Return if symptoms worsen or fail to improve.  I, Darice Smock, CMA, am acting as scribe for RUFUS CHRISTELLA HOLY, MD.   Documentation: I have reviewed the above documentation for accuracy and completeness, and I agree with the above.  RUFUS CHRISTELLA HOLY, MD

## 2023-08-04 NOTE — Patient Instructions (Addendum)
 Patient Handout: Wound Care for Skin Biopsy Site  Taking Care of Your Skin Biopsy Site  Proper care of the biopsy site is essential for promoting healing and minimizing scarring. This handout provides instructions on how to care for your biopsy site to ensure optimal recovery.  1. Cleaning the Wound:  Clean the biopsy site daily with gentle soap and water. Gently pat the area dry with a clean, soft towel. Avoid harsh scrubbing or rubbing the area, as this can irritate the skin and delay healing.  2. Applying Aquaphor and Bandage:  After cleaning the wound, apply a thin layer of Aquaphor ointment to the biopsy site. Cover the area with a sterile bandage to protect it from dirt, bacteria, and friction. Change the bandage daily or as needed if it becomes soiled or wet.  3. Continued Care for One Week:  Repeat the cleaning, Aquaphor application, and bandaging process daily for one week following the biopsy procedure. Keeping the wound clean and moist during this initial healing period will help prevent infection and promote optimal healing.  4. Massaging Aquaphor into the Area:  ---After one week, discontinue the use of bandages but continue to apply Aquaphor to the biopsy site. ----Gently massage the Aquaphor into the area using circular motions. ---Massaging the skin helps to promote circulation and prevent the formation of scar tissue.   Additional Tips:  Avoid exposing the biopsy site to direct sunlight during the healing process, as this can cause hyperpigmentation or worsen scarring. If you experience any signs of infection, such as increased redness, swelling, warmth, or drainage from the wound, contact your healthcare provider immediately. Follow any additional instructions provided by your healthcare provider for caring for the biopsy site and managing any discomfort. Conclusion:  Taking proper care of your skin biopsy site is crucial for ensuring optimal healing and  minimizing scarring. By following these instructions for cleaning, applying Aquaphor, and massaging the area, you can promote a smooth and successful recovery. If you have any questions or concerns about caring for your biopsy site, don't hesitate to contact your healthcare provider for guidance.   Skin Education : We counseled the patient regarding the following: Sun screen (SPF 30 or greater) should be applied during peak UV exposure (between 10am and 2pm) and reapplied after exercise or swimming.  The ABCDEs of melanoma were reviewed with the patient, and the importance of monthly self-examination of moles was emphasized. Should any moles change in shape or color, or itch, bleed or burn, pt will contact our office for evaluation sooner then their interval appointment.  Plan: Sunscreen Recommendations We recommended a broad spectrum sunscreen with a SPF of 30 or higher. ISPF 30 sunscreens block approximately 97 percent of the sun's harmful rays. Sunscreens should be applied at least 15 minutes prior to expected sun exposure and then every 2 hours after that as long as sun exposure continues. If swimming or exercising sunscreen should be reapplied every 45 minutes to an hour after getting wet or sweating. One ounce, or the equivalent of a shot glass full of sunscreen, is adequate to protect the skin not covered by a bathing suit. We also recommended a lip balm with a sunscreen as well. Sun protective clothing can be used in lieu of sunscreen but must be worn the entire time you are exposed to the sun's rays.   Important Information   Due to recent changes in healthcare laws, you may see results of your pathology and/or laboratory studies on MyChart before  the doctors have had a chance to review them. We understand that in some cases there may be results that are confusing or concerning to you. Please understand that not all results are received at the same time and often the doctors may need to  interpret multiple results in order to provide you with the best plan of care or course of treatment. Therefore, we ask that you please give us  2 business days to thoroughly review all your results before contacting the office for clarification. Should we see a critical lab result, you will be contacted sooner.     If You Need Anything After Your Visit   If you have any questions or concerns for your doctor, please call our main line at [phone]. If no one answers, please leave a voicemail as directed and we will return your call as soon as possible. Messages left after 4 pm will be answered the following business day.    You may also send us  a message via MyChart. We typically respond to MyChart messages within 1-2 business days.  For prescription refills, please ask your pharmacy to contact our office. Our fax number is [phone].  If you have an urgent issue when the clinic is closed that cannot wait until the next business day, you can page your doctor at the number below.     Please note that while we do our best to be available for urgent issues outside of office hours, we are not available 24/7.    If you have an urgent issue and are unable to reach us , you may choose to seek medical care at your doctor's office, retail clinic, urgent care center, or emergency room.   If you have a medical emergency, please immediately call 911 or go to the emergency department. In the event of inclement weather, please call our main line at [phone] for an update on the status of any delays or closures.  Dermatology Medication Tips: Please keep the boxes that topical medications come in in order to help keep track of the instructions about where and how to use these. Pharmacies typically print the medication instructions only on the boxes and not directly on the medication tubes.   If your medication is too expensive, please contact our office at [phone] or send us  a message through  MyChart.    We are unable to tell what your co-pay for medications will be in advance as this is different depending on your insurance coverage. However, we may be able to find a substitute medication at lower cost or fill out paperwork to get insurance to cover a needed medication.    If a prior authorization is required to get your medication covered by your insurance company, please allow us  1-2 business days to complete this process.   Drug prices often vary depending on where the prescription is filled and some pharmacies may offer cheaper prices.   The website www.goodrx.com contains coupons for medications through different pharmacies. The prices here do not account for what the cost may be with help from insurance (it may be cheaper with your insurance), but the website can give you the price if you did not use any insurance.  - You can print the associated coupon and take it with your prescription to the pharmacy.  - You may also stop by our office during regular business hours and pick up a GoodRx coupon card.  - If you need your prescription sent electronically to a different pharmacy, notify  our office through [facility] or by phone at [phone]

## 2023-08-06 ENCOUNTER — Ambulatory Visit: Payer: Self-pay | Admitting: Emergency Medicine

## 2023-08-06 ENCOUNTER — Ambulatory Visit: Payer: Self-pay | Admitting: Dermatology

## 2023-08-06 LAB — SURGICAL PATHOLOGY

## 2023-08-13 ENCOUNTER — Encounter: Payer: Self-pay | Admitting: Dermatology

## 2023-08-31 ENCOUNTER — Encounter: Payer: Self-pay | Admitting: Dermatology

## 2023-08-31 ENCOUNTER — Ambulatory Visit: Admitting: Dermatology

## 2023-08-31 VITALS — BP 121/69 | HR 71 | Temp 97.9°F

## 2023-08-31 DIAGNOSIS — D0361 Melanoma in situ of right upper limb, including shoulder: Secondary | ICD-10-CM

## 2023-08-31 DIAGNOSIS — C439 Malignant melanoma of skin, unspecified: Secondary | ICD-10-CM

## 2023-08-31 NOTE — Patient Instructions (Signed)

## 2023-08-31 NOTE — Progress Notes (Signed)
 Follow-Up Visit   Subjective  Janet Kramer is a 78 y.o. female who presents for the following: Mohs of a Melanoma in Situ on the right forearm, biopsied by Dr. Corey.   The following portions of the chart were reviewed this encounter and updated as appropriate: medications, allergies, medical history  Review of Systems:  No other skin or systemic complaints except as noted in HPI or Assessment and Plan.  Objective  Well appearing patient in no apparent distress; mood and affect are within normal limits.  A focused examination was performed of the following areas: Right forearm Relevant physical exam findings are noted in the Assessment and Plan.   Right Forearm Healing biopsy site   Assessment & Plan   MALIGNANT MELANOMA OF SKIN (HCC) Right Forearm Mohs surgery  Consent obtained: written  Anticoagulation: Was the anticoagulation regimen changed prior to Mohs? No    Anesthesia: Anesthesia method: local infiltration Local anesthetic: lidocaine  1% WITH epi  Procedure Details: Timeout: pre-procedure verification complete Procedure Prep: patient was prepped and draped in usual sterile fashion Prep type: chlorhexidine Biopsy accession number: [id number] Pre-Op diagnosis: melanoma Melanoma subtype: in situ MohsAIQ Surgical site (if tumor spans multiple areas, please select predominant area): upper extremity Surgery side: right Surgical site (from skin exam): Right Forearm Pre-operative length (cm): 1.3 Pre-operative width (cm): 1.2 Indications for Mohs surgery: anatomic location where tissue conservation is critical and aggressive histology  Micrographic Surgery Details: Post-operative length (cm): 2.3 Post-operative width (cm): 2 Number of Mohs stages: 1 Cumulative additional sections past 5 per stage: 0 Post surgery depth of defect: subcutaneous fat  Stage 1    Tumor features identified on Mohs section: no tumor identified  Reconstruction: Was the defect  reconstructed? Yes   Was reconstruction performed by the same Mohs surgeon? Yes   Setting of reconstruction: outpatient office When was reconstruction performed? same day Type of reconstruction: linear Linear reconstruction: complex  Skin repair Complexity:  Complex Final length (cm):  5.8 Informed consent: discussed and consent obtained   Timeout: patient name, date of birth, surgical site, and procedure verified   Procedure prep:  Patient was prepped and draped in usual sterile fashion Prep type:  Chlorhexidine Anesthesia: the lesion was anesthetized in a standard fashion   Anesthetic:  1% lidocaine  w/ epinephrine 1-100,000 buffered w/ 8.4% NaHCO3 Reason for type of repair: reduce tension to allow closure, preserve normal anatomy, preserve normal anatomical and functional relationships, avoid adjacent structures and allow side-to-side closure without requiring a flap or graft   Undermining: area extensively undermined   Subcutaneous layers (deep stitches):  Suture size:  4-0 (with dermabond and steri strips) Suture type: Vicryl (polyglactin 910)   Stitches:  Buried vertical mattress Fine/surface layer approximation (top stitches):  Suture type: cyanoacrylate tissue glue   Hemostasis achieved with: suture, pressure and electrodesiccation Outcome: patient tolerated procedure well with no complications   Post-procedure details: sterile dressing applied and wound care instructions given   Dressing type: pressure dressing and petrolatum      Return in 4 weeks (on 09/28/2023) for TBSE.  Janet Kramer, CMA, am acting as scribe for RUFUS CHRISTELLA COREY, MD.    08/31/2023  HISTORY OF PRESENT ILLNESS  Janet Kramer is seen in consultation at the request of Dr. Corey for biopsy-proven Melanoma in Situ of the right forearm. They note that the area has been present for about 4 months increasing in size with time.  There is no history of previous treatment.  Reports  no other new or changing  lesions and has no other complaints today.  Medications and allergies: see patient chart.  Review of systems: Reviewed 8 systems and notable for the above skin cancer.  All other systems reviewed are unremarkable/negative, unless noted in the HPI. Past medical history, surgical history, family history, social history were also reviewed and are noted in the chart/questionnaire.    PHYSICAL EXAMINATION  General: Well-appearing, in no acute distress, alert and oriented x 4. Vitals reviewed in chart (if available).   Skin: Exam reveals a 1.3 x 1.2 cm erythematous papule and biopsy scar on the right forearm. There are rhytids, telangiectasias, and lentigines, consistent with photodamage.  Biopsy report(s) reviewed, confirming the diagnosis.   ASSESSMENT  1) Melanoma in Situ of the right forearm 2) photodamage 3) solar lentigines   PLAN   1. Due to location, size, histology, or recurrence and the likelihood of subclinical extension as well as the need to conserve normal surrounding tissue, the patient was deemed acceptable for Mohs micrographic surgery (MMS).  The nature and purpose of the procedure, associated benefits and risks including recurrence and scarring, possible complications such as pain, infection, and bleeding, and alternative methods of treatment if appropriate were discussed with the patient during consent. The lesion location was verified by the patient, by reviewing previous notes, pathology reports, and by photographs as well as angulation measurements if available.  Informed consent was reviewed and signed by the patient, and timeout was performed at 8:15 AM. See op note below.  2. For the photodamage and solar lentigines, sun protection discussed/information given on OTC sunscreens, and we recommend continued regular follow-up with primary dermatologist every 6 months or sooner for any growing, bleeding, or changing lesions. 3. Prognosis and future surveillance discussed. 4.  Letter with treatment outcome sent to referring provider. 5. Pain acetaminophen/ibuprofen  MOHS MICROGRAPHIC SURGERY AND RECONSTRUCTION  Initial size:   1.3 x 1.2 cm Surgical defect/wound size: 2.3 x 2.0 cm Anesthesia:    0.33% lidocaine  with 1:200,000 epinephrine EBL:    <5 mL Complications:  None Repair type:   Complex SQ suture:   4-0 Vicryl Cutaneous suture:  Cyanoacrylate and Steristrips Final size of the repair: 5.8 cm  Stages: 1  STAGE I: Anesthesia achieved with 0.5% lidocaine  with 1:200,000 epinephrine. ChloraPrep applied. 5 section(s) excised using Mohs technique (this includes total peripheral and deep tissue margin excision and evaluation with frozen sections, excised and interpreted by the same physician). The tumor was first debulked and then excised with an approx. 2mm margin.  Hemostasis was achieved with electrocautery as needed.  The specimen was then oriented, subdivided/relaxed, inked, and processed using Mohs technique.  Tissue was stained with H&E and MART-1 with 2 chromogens (2 immunostains).  Frozen section analysis revealed a clear deep and peripheral margin.   Reconstruction  The surgical wound was then cleaned, prepped, and re-anesthetized as above. Wound edges were undermined extensively along at least one entire edge and at a distance equal to or greater than the width of the defect (see wound defect size above) in order to achieve closure and decrease wound tension and anatomic distortion. Redundant tissue repair including standing cone removal was performed. Hemostasis was achieved with electrocautery. Subcutaneous and epidermal tissues were approximated with the above sutures. The surgical site was then lightly scrubbed with sterile, saline-soaked gauze. Steri-strips were applied, and the area was then bandaged using Vaseline ointment, non-adherent gauze, gauze pads, and tape to provide an adequate pressure dressing. The patient tolerated  the procedure well,  was given detailed written and verbal wound care instructions, and was discharged in good condition.   The patient will follow-up: 4 weeks.    Documentation: I have reviewed the above documentation for accuracy and completeness, and I agree with the above.  RUFUS CHRISTELLA HOLY, MD

## 2023-09-01 ENCOUNTER — Encounter: Payer: Self-pay | Admitting: Dermatology

## 2023-09-03 ENCOUNTER — Encounter: Admitting: Dermatology

## 2023-09-28 ENCOUNTER — Ambulatory Visit: Admitting: Dermatology

## 2023-09-28 ENCOUNTER — Ambulatory Visit (INDEPENDENT_AMBULATORY_CARE_PROVIDER_SITE_OTHER): Admitting: Physician Assistant

## 2023-09-28 ENCOUNTER — Encounter: Payer: Self-pay | Admitting: Physician Assistant

## 2023-09-28 ENCOUNTER — Encounter: Payer: Self-pay | Admitting: Dermatology

## 2023-09-28 VITALS — BP 120/74 | HR 72

## 2023-09-28 DIAGNOSIS — Z1283 Encounter for screening for malignant neoplasm of skin: Secondary | ICD-10-CM

## 2023-09-28 DIAGNOSIS — L905 Scar conditions and fibrosis of skin: Secondary | ICD-10-CM

## 2023-09-28 DIAGNOSIS — L814 Other melanin hyperpigmentation: Secondary | ICD-10-CM

## 2023-09-28 DIAGNOSIS — D229 Melanocytic nevi, unspecified: Secondary | ICD-10-CM

## 2023-09-28 DIAGNOSIS — L821 Other seborrheic keratosis: Secondary | ICD-10-CM | POA: Diagnosis not present

## 2023-09-28 DIAGNOSIS — L578 Other skin changes due to chronic exposure to nonionizing radiation: Secondary | ICD-10-CM | POA: Diagnosis not present

## 2023-09-28 DIAGNOSIS — W908XXA Exposure to other nonionizing radiation, initial encounter: Secondary | ICD-10-CM | POA: Diagnosis not present

## 2023-09-28 DIAGNOSIS — Z48817 Encounter for surgical aftercare following surgery on the skin and subcutaneous tissue: Secondary | ICD-10-CM | POA: Diagnosis not present

## 2023-09-28 DIAGNOSIS — Z86006 Personal history of melanoma in-situ: Secondary | ICD-10-CM

## 2023-09-28 DIAGNOSIS — Z85820 Personal history of malignant melanoma of skin: Secondary | ICD-10-CM | POA: Diagnosis not present

## 2023-09-28 DIAGNOSIS — D1801 Hemangioma of skin and subcutaneous tissue: Secondary | ICD-10-CM | POA: Diagnosis not present

## 2023-09-28 DIAGNOSIS — C439 Malignant melanoma of skin, unspecified: Secondary | ICD-10-CM

## 2023-09-28 NOTE — Progress Notes (Signed)
   Follow Up Visit   Subjective  Janet Kramer is a 78 y.o. female who presents for the following: follow up from Mohs surgery   The patient presents for follow up from Mohs surgery for a MIS on the right forearm, treated on 08/31/23, repaired with linear closure. The patient has been bandaging the wound as directed. The endorse the following concerns: none  The following portions of the chart were reviewed this encounter and updated as appropriate: medications, allergies, medical history  Review of Systems:  No other skin or systemic complaints except as noted in HPI or Assessment and Plan.  Objective  Well appearing patient in no apparent distress; mood and affect are within normal limits.  A focal examination was performed including scalp, head, face and right forearm. All findings within normal limits unless otherwise noted below.  Healing wound with mild erythema  Relevant physical exam findings are noted in the Assessment and Plan.    Assessment & Plan   Scar s/p Mohs for MIS on the right forearm, treated on 08/31/23, repaired with linear closure - Reassured that wound is healing well - No evidence of infection - No swelling, induration, purulence, dehiscence, or tenderness out of proportion to the clinical exam, see photo above - Discussed that scars take up to 12 months to mature from the date of surgery - Recommend SPF 30+ to scar daily to prevent purple color from UV exposure during scar maturation process - Discussed that erythema and raised appearance of scar will fade over the next 4-6 months - OK to start scar massage at 4-6 weeks post-op - Can consider silicone based products for scar healing starting at 6 weeks post-op - Ok to continue ointment daily to wound under a bandage for another week  HISTORY OF MELANOMA IN SITU - No evidence of recurrence today - Recommend regular full body skin exams - Recommend daily broad spectrum sunscreen SPF 30+ to sun-exposed areas,  reapply every 2 hours as needed.  - Call if any new or changing lesions are noted between office visits   Return for q3 months TBSE with Janet Kramer.  I, Darice Smock, CMA, am acting as scribe for RUFUS CHRISTELLA HOLY, MD.   Documentation: I have reviewed the above documentation for accuracy and completeness, and I agree with the above.  RUFUS CHRISTELLA HOLY, MD

## 2023-09-28 NOTE — Progress Notes (Signed)
   Total Body Skin Exam (TBSE) Visit   Subjective  Janet Kramer is a 78 y.o. female ESTABLISHED PATIENT who presents for the following: Skin Cancer Screening and Full Body Skin Exam  Patient presents today for follow up visit for TBSE. Patient was last evaluated on 08/31/23 by Dr Corey . She has history of melanoma. Patient denies medication changes. Patient reports she does have spots, moles and lesions of concern to be evaluated one is on her left arm, left temple and the middle of her back. Patient reports throughout her lifetime she has had moderate sun exposure. Currently, patient reports if she has excessive sun exposure, she does not apply sunscreen and/or wears protective coverings. Patient reports she has hx of bx. Patient admits to  family history of skin cancers.Patient reports that her grandfather did have skin cancer but unsure of what kind. The patient has spots, moles and lesions to be evaluated, some may be new or changing and the patient has concerns that these could be cancer.  The following portions of the chart were reviewed this encounter and updated as appropriate: medications, allergies, medical history  Review of Systems:  No other skin or systemic complaints except as noted in HPI or Assessment and Plan.  Objective  Well appearing patient in no apparent distress; mood and affect are within normal limits.  A full examination was performed including scalp, head, eyes, ears, nose, lips, neck, chest, axillae, abdomen, back, buttocks, bilateral upper extremities, bilateral lower extremities, hands, feet, fingers, toes, fingernails, and toenails. All findings within normal limits unless otherwise noted below.   Relevant physical exam findings are noted in the Assessment and Plan.  NO CERVICAL, AXILLARY OR INGUINAL LYMPHADENOPATHY.     Assessment & Plan   LENTIGINES, SEBORRHEIC KERATOSES, HEMANGIOMAS - Benign normal skin lesions - Benign-appearing - Call for any  changes  MELANOCYTIC NEVI - Tan-brown and/or pink-flesh-colored symmetric macules and papules - Benign appearing on exam today - Observation - Call clinic for new or changing moles - Recommend daily use of broad spectrum spf 30+ sunscreen to sun-exposed areas.   ACTINIC DAMAGE - Chronic condition, secondary to cumulative UV/sun exposure - diffuse scaly erythematous macules with underlying dyspigmentation - Recommend daily broad spectrum sunscreen SPF 30+ to sun-exposed areas, reapply every 2 hours as needed.  - Staying in the shade or wearing long sleeves, sun glasses (UVA+UVB protection) and wide brim hats (4-inch brim around the entire circumference of the hat) are also recommended for sun protection.  - Call for new or changing lesions.  SKIN CANCER SCREENING PERFORMED TODAY.  HISTORY OF MELANOMA - No evidence of recurrence today - No lymphadenopathy - Recommend regular full body skin exams - Recommend daily broad spectrum sunscreen SPF 30+ to sun-exposed areas, reapply every 2 hours as needed.  - Call if any new or changing lesions are noted between office visits     HISTORY OF MELANOMA   MULTIPLE BENIGN NEVI   SCREENING EXAM FOR SKIN CANCER   LENTIGINES   SEBORRHEIC KERATOSIS   CHERRY ANGIOMA   ACTINIC SKIN DAMAGE   Return in about 6 months (around 03/27/2024) for TBSE follow up.  I, Doyce Pan, CMA, am acting as scribe for Emerly Prak K, PA-C.   Documentation: I have reviewed the above documentation for accuracy and completeness, and I agree with the above.  Eryck Negron K, PA-C

## 2023-09-28 NOTE — Patient Instructions (Signed)

## 2023-09-28 NOTE — Patient Instructions (Addendum)

## 2023-11-12 ENCOUNTER — Ambulatory Visit (INDEPENDENT_AMBULATORY_CARE_PROVIDER_SITE_OTHER): Admitting: Emergency Medicine

## 2023-11-12 ENCOUNTER — Encounter: Payer: Self-pay | Admitting: Emergency Medicine

## 2023-11-12 VITALS — BP 116/80 | HR 81 | Temp 98.1°F | Ht 64.0 in | Wt 128.0 lb

## 2023-11-12 DIAGNOSIS — K429 Umbilical hernia without obstruction or gangrene: Secondary | ICD-10-CM | POA: Diagnosis not present

## 2023-11-12 NOTE — Progress Notes (Signed)
 Ronal JAYSON Fair 78 y.o.   Chief Complaint  Patient presents with   Umbilical Hernia    Pt states that she was doing a sit up and it appeared     HISTORY OF PRESENT ILLNESS: This is a 78 y.o. female complaining of possible umbilical hernia.  Nontender Asymptomatic.  Noticed while doing sit ups. No other complaints or medical concerns today.  HPI   Prior to Admission medications   Medication Sig Start Date End Date Taking? Authorizing Provider  aspirin 325 MG tablet Take 325 mg by mouth daily.   Yes [provider]  calcium citrate-vitamin D (CITRACAL+D) 315-200 MG-UNIT tablet Take 1 tablet by mouth daily.   Yes [provider]  clobetasol  (TEMOVATE ) 0.05 % external solution Apply 1 Application topically 2 (two) times daily. 08/04/23  Yes Paci, Karina M, MD  cyanocobalamin  (VITAMIN B12) 500 MCG tablet Take 500 mcg by mouth daily.   Yes [provider]  latanoprost (XALATAN) 0.005 % ophthalmic solution Place 1 drop into both eyes at bedtime.   Yes [provider]  Multiple Vitamins-Minerals (PRESERVISION AREDS 2 PO) Take by mouth.   Yes [provider]    Allergies  Allergen Reactions   Codeine     Overly sleepy   Oxycontin [Oxycodone Hcl] Nausea And Vomiting    Patient Active Problem List   Diagnosis Date Noted   Umbilical hernia without obstruction and without gangrene 11/12/2023   Arthralgia of multiple joints 04/08/2022   Dyslipidemia 01/19/2018    Past Medical History:  Diagnosis Date   Allergy    Anemia    as a child   Arthritis    Asthma    Bronchitis    has recurrent bronchitis   Cataract    Glaucoma    Hyperlipidemia     Past Surgical History:  Procedure Laterality Date   COLONOSCOPY     EYE SURGERY  1975   right   FRACTURE SURGERY  2001   ankle - right      ROTATOR CUFF REPAIR     TONSILLECTOMY  1949    Social History   Socioeconomic History   Marital status: Married    Spouse name: Not on file    Number of children: 2   Years of education: Not on file   Highest education level: Not on file  Occupational History   Occupation: Chemical Engineer: [employer]  Tobacco Use   Smoking status: Never   Smokeless tobacco: Never  Vaping Use   Vaping status: Never Used  Substance and Sexual Activity   Alcohol  use: Yes    Alcohol /week: 2.0 standard drinks of alcohol     Types: 2 Glasses of wine per week    Comment: Occasional beer with Mexican food   Drug use: No   Sexual activity: Not Currently    Birth control/protection: None  Other Topics Concern   Not on file  Social History Narrative   Marital status: married x 38 years; second marriage      Children: 2 children; 5 grandchildren; no gg      Lives: with spouse      Employment: data processing manager at [employer]; in 2018      Tobacco: none      Alcohol :  Socially; weekends      Exercise:  No formal exercise; 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      ADLs: independent with ADLs.      Advanced Directives: NO;  FULL CODE; no prolonged measures; HCPOA: daughter not husband; Ronal Comer Pickett.   Social Drivers of Corporate Investment Banker Strain: Not on file  Food Insecurity: Not on file  Transportation Needs: Not on file  Physical Activity: Not on file  Stress: Not on file  Social Connections: Not on file  Intimate Partner Violence: Not on file    Family History  Problem Relation Age of Onset   Cancer Mother        colon   Colon cancer Mother 15   Cancer Father        lung   Diabetes Paternal Grandmother    Stroke Maternal Grandmother    Heart disease Maternal Grandfather    Colon polyps Daughter    Vision loss Maternal Aunt    Stomach cancer Neg Hx    Esophageal cancer Neg Hx    Rectal cancer Neg Hx      Review of Systems  Constitutional: Negative.  Negative for chills and fever.  HENT: Negative.  Negative for congestion and sore throat.   Respiratory: Negative.  Negative for cough and  shortness of breath.   Cardiovascular: Negative.  Negative for chest pain and palpitations.  Gastrointestinal:  Negative for abdominal pain, diarrhea, nausea and vomiting.  Genitourinary: Negative.  Negative for dysuria and hematuria.  Skin: Negative.  Negative for rash.  Neurological: Negative.  Negative for dizziness and headaches.  All other systems reviewed and are negative.   Vitals:   11/12/23 1422  BP: 116/80  Pulse: 81  Temp: 98.1 F (36.7 C)  SpO2: 94%    Physical Exam Vitals reviewed.  Constitutional:      Appearance: Normal appearance.  HENT:     Head: Normocephalic.  Eyes:     Extraocular Movements: Extraocular movements intact.  Cardiovascular:     Rate and Rhythm: Normal rate.  Pulmonary:     Effort: Pulmonary effort is normal.  Abdominal:     Palpations: Abdomen is soft.     Tenderness: There is no abdominal tenderness.     Hernia: A hernia (Small nontender umbilical hernia) is present.  Skin:    General: Skin is warm and dry.     Capillary Refill: Capillary refill takes less than 2 seconds.  Neurological:     General: No focal deficit present.     Mental Status: She is alert and oriented to person, place, and time.  Psychiatric:        Mood and Affect: Mood normal.        Behavior: Behavior normal.      ASSESSMENT & PLAN: Problem List Items Addressed This Visit       Other   Umbilical hernia without obstruction and without gangrene - Primary   Clinically stable.  No red flag signs or symptoms. Small umbilical hernia on physical examination.  Nontender. Pain management discussed. Recommend conservative management at this time Offered surgical evaluation.  Declined at this time. ED precautions given. Advised to contact the office if no better or worse during the next several days or weeks.      Patient Instructions  Umbilical Hernia, Adult  A hernia is a lump of tissue that pushes through an opening in the muscles. An umbilical hernia  happens in the belly, near the belly button. The hernia may contain tissues from the small or large intestine. It may also have fatty tissue that covers the intestines. Umbilical hernias in adults may get worse over time. They need to be treated  with surgery. There are several types of umbilical hernias. They include: Indirect hernia. This occurs just above or below the belly button. It's the most common type of umbilical hernia in adults. Direct hernia. This type occurs in an opening that's formed by the belly button. Reducible hernia. This hernia comes and goes. You may see it only when you strain, cough, or lift something heavy. This type of hernia can be pushed back into the belly (reduced). Incarcerated hernia. This traps the hernia in the wall of the belly. This type of hernia can't be pushed back into the belly. It can cause a strangulated hernia. Strangulated hernia. This hernia cuts off blood flow to the tissues inside the hernia. The tissues can die if this happens. This type of hernia must be treated right away. What are the causes? An umbilical hernia happens when tissue inside the belly pushes through an opening in the muscles of the belly. What increases the risk? You're more likely to get this hernia if: You strain while lifting or pushing heavy objects. You've had several pregnancies. You have a condition that puts pressure on your belly, and you've had it for a long time. These include: Obesity. A buildup of fluid inside your belly. Vomiting or coughing all the time. Trouble pooping (constipation). You've had surgery that weakened the muscles in the belly. What are the signs or symptoms? The main symptom of this condition is a bulge at the belly button or near it. The bulge does not cause pain. Other symptoms depend on the type of hernia you have. A reducible hernia may be seen only when you strain, cough, or lift something heavy. Other symptoms may include: Dull pain. A  feeling of pressure. An incarcerated hernia may cause very bad pain. Also, you may: Vomit or feel like you may vomit. Not be able to pass gas. A strangulated hernia may cause: Pain that gets worse and worse. Vomiting, or feeling like you may vomit. Pain when you press on the hernia. Change of color on the skin over the hernia. The skin may become red or purple. Trouble pooping. Blood in the poop. How is this diagnosed? This condition may be diagnosed based on: Your symptoms and medical history. A physical exam. You may be asked to cough or strain while standing. These actions will put pressure inside your belly. The pressure can force the hernia through the opening in your muscles. Your health care provider may try to push the hernia back into your belly (reduce). How is this treated? Surgery is the only treatment for an umbilical hernia. Surgery for a strangulated hernia must be done right away. If you have a small hernia that's not incarcerated, you may need to lose weight before the surgery is done. Follow these instructions at home: Managing constipation You may need to take these actions to prevent trouble pooping. This will help to prevent straining. Drink enough fluid to keep your pee (urine) pale yellow. Take over-the-counter or prescription medicines. Eat foods that are high in fiber, such as beans, whole grains, and fresh fruits and vegetables. Limit foods that are high in fat and sugars, such as fried or sweet foods. General instructions Do not try to push the hernia back in. Lose weight, if told by your provider. Watch your hernia for any changes in color or size. Tell your provider if any changes occur. You may need to avoid activities that put pressure on your hernia. You may have to avoid lifting. Ask your provider  how much you can safely lift. Take over-the-counter and prescription medicines only as told by your provider. Contact a health care provider if: Your  hernia gets larger or feels hard. Your hernia becomes painful. You get a fever or chills. Get help right away if: You get very bad pain near the area of the hernia, and the pain comes on suddenly. You have pain and you vomit or feel like you may vomit. The skin over your hernia changes color. These symptoms may be an emergency. Get help right away. Call 911. Do not wait to see if the symptoms go away. Do not drive yourself to the hospital. This information is not intended to replace advice given to you by your health care provider. Make sure you discuss any questions you have with your health care provider. Document Revised: 04/22/2022 Document Reviewed: 04/22/2022 Elsevier Patient Education  2024 Elsevier Inc.     Emil Schaumann, MD [facility]

## 2023-11-12 NOTE — Assessment & Plan Note (Signed)
 Clinically stable.  No red flag signs or symptoms. Small umbilical hernia on physical examination.  Nontender. Pain management discussed. Recommend conservative management at this time Offered surgical evaluation.  Declined at this time. ED precautions given. Advised to contact the office if no better or worse during the next several days or weeks.

## 2023-11-12 NOTE — Patient Instructions (Signed)
 Umbilical Hernia, Adult  A hernia is a lump of tissue that pushes through an opening in the muscles. An umbilical hernia happens in the belly, near the belly button. The hernia may contain tissues from the small or large intestine. It may also have fatty tissue that covers the intestines. Umbilical hernias in adults may get worse over time. They need to be treated with surgery. There are several types of umbilical hernias. They include: Indirect hernia. This occurs just above or below the belly button. It's the most common type of umbilical hernia in adults. Direct hernia. This type occurs in an opening that's formed by the belly button. Reducible hernia. This hernia comes and goes. You may see it only when you strain, cough, or lift something heavy. This type of hernia can be pushed back into the belly (reduced). Incarcerated hernia. This traps the hernia in the wall of the belly. This type of hernia can't be pushed back into the belly. It can cause a strangulated hernia. Strangulated hernia. This hernia cuts off blood flow to the tissues inside the hernia. The tissues can die if this happens. This type of hernia must be treated right away. What are the causes? An umbilical hernia happens when tissue inside the belly pushes through an opening in the muscles of the belly. What increases the risk? You're more likely to get this hernia if: You strain while lifting or pushing heavy objects. You've had several pregnancies. You have a condition that puts pressure on your belly, and you've had it for a long time. These include: Obesity. A buildup of fluid inside your belly. Vomiting or coughing all the time. Trouble pooping (constipation). You've had surgery that weakened the muscles in the belly. What are the signs or symptoms? The main symptom of this condition is a bulge at the belly button or near it. The bulge does not cause pain. Other symptoms depend on the type of hernia you have. A  reducible hernia may be seen only when you strain, cough, or lift something heavy. Other symptoms may include: Dull pain. A feeling of pressure. An incarcerated hernia may cause very bad pain. Also, you may: Vomit or feel like you may vomit. Not be able to pass gas. A strangulated hernia may cause: Pain that gets worse and worse. Vomiting, or feeling like you may vomit. Pain when you press on the hernia. Change of color on the skin over the hernia. The skin may become red or purple. Trouble pooping. Blood in the poop. How is this diagnosed? This condition may be diagnosed based on: Your symptoms and medical history. A physical exam. You may be asked to cough or strain while standing. These actions will put pressure inside your belly. The pressure can force the hernia through the opening in your muscles. Your health care provider may try to push the hernia back into your belly (reduce). How is this treated? Surgery is the only treatment for an umbilical hernia. Surgery for a strangulated hernia must be done right away. If you have a small hernia that's not incarcerated, you may need to lose weight before the surgery is done. Follow these instructions at home: Managing constipation You may need to take these actions to prevent trouble pooping. This will help to prevent straining. Drink enough fluid to keep your pee (urine) pale yellow. Take over-the-counter or prescription medicines. Eat foods that are high in fiber, such as beans, whole grains, and fresh fruits and vegetables. Limit foods that are high in  fat and sugars, such as fried or sweet foods. General instructions Do not try to push the hernia back in. Lose weight, if told by your provider. Watch your hernia for any changes in color or size. Tell your provider if any changes occur. You may need to avoid activities that put pressure on your hernia. You may have to avoid lifting. Ask your provider how much you can safely  lift. Take over-the-counter and prescription medicines only as told by your provider. Contact a health care provider if: Your hernia gets larger or feels hard. Your hernia becomes painful. You get a fever or chills. Get help right away if: You get very bad pain near the area of the hernia, and the pain comes on suddenly. You have pain and you vomit or feel like you may vomit. The skin over your hernia changes color. These symptoms may be an emergency. Get help right away. Call 911. Do not wait to see if the symptoms go away. Do not drive yourself to the hospital. This information is not intended to replace advice given to you by your health care provider. Make sure you discuss any questions you have with your health care provider. Document Revised: 04/22/2022 Document Reviewed: 04/22/2022 Elsevier Patient Education  2024 [employer].

## 2024-03-28 ENCOUNTER — Ambulatory Visit: Admitting: Physician Assistant
